# Patient Record
Sex: Female | Born: 1990 | Race: Asian | Hispanic: No | Marital: Married | Smoking: Never smoker
Health system: Southern US, Community
[De-identification: ages and names within clinical notes are randomized; demographics above are authoritative.]

## PROBLEM LIST (undated history)

## (undated) ENCOUNTER — Inpatient Hospital Stay (HOSPITAL_COMMUNITY): Payer: Self-pay

## (undated) DIAGNOSIS — Z789 Other specified health status: Secondary | ICD-10-CM

## (undated) HISTORY — PX: WISDOM TOOTH EXTRACTION: SHX21

## (undated) HISTORY — PX: NO PAST SURGERIES: SHX2092

---

## 2015-01-13 ENCOUNTER — Ambulatory Visit (INDEPENDENT_AMBULATORY_CARE_PROVIDER_SITE_OTHER): Payer: BLUE CROSS/BLUE SHIELD | Admitting: Family Medicine

## 2015-01-13 VITALS — BP 122/80 | HR 74 | Temp 98.1°F | Resp 18 | Ht 62.0 in | Wt 107.0 lb

## 2015-01-13 DIAGNOSIS — R399 Unspecified symptoms and signs involving the genitourinary system: Secondary | ICD-10-CM

## 2015-01-13 DIAGNOSIS — Z124 Encounter for screening for malignant neoplasm of cervix: Secondary | ICD-10-CM

## 2015-01-13 DIAGNOSIS — B373 Candidiasis of vulva and vagina: Secondary | ICD-10-CM

## 2015-01-13 DIAGNOSIS — N898 Other specified noninflammatory disorders of vagina: Secondary | ICD-10-CM

## 2015-01-13 DIAGNOSIS — R110 Nausea: Secondary | ICD-10-CM

## 2015-01-13 DIAGNOSIS — B3731 Acute candidiasis of vulva and vagina: Secondary | ICD-10-CM

## 2015-01-13 DIAGNOSIS — Z113 Encounter for screening for infections with a predominantly sexual mode of transmission: Secondary | ICD-10-CM | POA: Diagnosis not present

## 2015-01-13 LAB — POCT WET PREP WITH KOH
KOH Prep POC: POSITIVE
RBC Wet Prep HPF POC: NEGATIVE
Trichomonas, UA: NEGATIVE
Yeast Wet Prep HPF POC: POSITIVE

## 2015-01-13 LAB — POCT URINALYSIS DIPSTICK
Bilirubin, UA: NEGATIVE
Blood, UA: NEGATIVE
Glucose, UA: NEGATIVE
Ketones, UA: NEGATIVE
Nitrite, UA: NEGATIVE
Protein, UA: NEGATIVE
Spec Grav, UA: 1.015
Urobilinogen, UA: 0.2
pH, UA: 5.5

## 2015-01-13 LAB — POCT UA - MICROSCOPIC ONLY
Casts, Ur, LPF, POC: NEGATIVE
Crystals, Ur, HPF, POC: NEGATIVE
Mucus, UA: NEGATIVE
RBC, urine, microscopic: NEGATIVE
Yeast, UA: NEGATIVE

## 2015-01-13 LAB — POCT URINE PREGNANCY: Preg Test, Ur: NEGATIVE

## 2015-01-13 MED ORDER — FLUCONAZOLE 150 MG PO TABS: 150.0000 mg | ORAL_TABLET | Freq: Once | ORAL | Status: DC

## 2015-01-13 NOTE — Progress Notes (Signed)
Chief Complaint:  Chief Complaint  Patient presents with  . Vaginal issues    itching, soreness, x 3days  . Vaginal Discharge    x 3 days  . Gastrophageal Reflux    Pt states hard to digest food, Reflux and burning x 4days  . Abnormal Period    Pt states duration of period has changed    HPI: Shelley Conner is a 24 y.o. female who is here for 3-4 day hsitory  Vaginal soreness, white dc, and also some mild odor. She ahs never had this before.  She is sexaully active, has never had pap, G0 ,no prior history of STD or vaginal issues but has never been tested.  She has ahd some nausea but no vomiting 3-4 x, she is tryign to get pregnant with her husband, recently here from [place] for about 1 year.  Last menstrual cycle was 12/19/14 Denies fevers chill abd pain back pain Has not tried anythign for this   History reviewed. No pertinent past medical history. History reviewed. No pertinent past surgical history. History   Social History  . Marital Status: Married    Spouse Name: N/A  . Number of Children: N/A  . Years of Education: N/A   Social History Main Topics  . Smoking status: Never Smoker   . Smokeless tobacco: Not on file  . Alcohol Use: Not on file  . Drug Use: Not on file  . Sexual Activity: Not on file   Other Topics Concern  . None   Social History Narrative  . None   History reviewed. No pertinent family history. No Known Allergies Prior to Admission medications   Not on File     ROS: The patient denies fevers, chills, night sweats, unintentional weight loss, chest pain, palpitations, wheezing, dyspnea on exertion, vomiting, abdominal pain,  hematuria, melena, numbness, weakness, or tingling.   All other systems have been reviewed and were otherwise negative with the exception of those mentioned in the HPI and as above.    PHYSICAL EXAM: Filed Vitals:   01/13/15 1509  BP: 122/80  Pulse: 74  Temp: 98.1 F (36.7 C)  Resp: 18   Filed Vitals:   01/13/15 1509  Height: 5\' 2"  (1.575 m)  Weight: 107 lb (48.535 kg)   Body mass index is 19.57 kg/(m^2).  General: Alert, no acute distress HEENT:  Normocephalic, atraumatic, oropharynx patent. EOMI, PERRLA Cardiovascular:  Regular rate and rhythm, no rubs murmurs or gallops.  No Carotid bruits, radial pulse intact. No pedal edema.  Respiratory: Clear to auscultation bilaterally.  No wheezes, rales, or rhonchi.  No cyanosis, no use of accessory musculature GI: No organomegaly, abdomen is soft and non-tender, positive bowel sounds.  No masses. Skin: No rashes. Neurologic: Facial musculature symmetric. Psychiatric: Patient is appropriate throughout our interaction. Lymphatic: No cervical lymphadenopathy Musculoskeletal: Gait intact. GU-+ white dc, + irritated cervical os, no CMT no massess, lesions, or rashes   LABS: Results for orders placed or performed in visit on 01/13/15  POCT urinalysis dipstick  Result Value Ref Range   Color, UA yellow    Clarity, UA clear    Glucose, UA neg    Bilirubin, UA neg    Ketones, UA neg    Spec Grav, UA 1.015    Blood, UA neg    pH, UA 5.5    Protein, UA neg    Urobilinogen, UA 0.2    Nitrite, UA neg    Leukocytes, UA small (1+)  POCT UA - Microscopic Only  Result Value Ref Range   WBC, Ur, HPF, POC 4-6    RBC, urine, microscopic neg    Bacteria, U Microscopic trace    Mucus, UA neg    Epithelial cells, urine per micros 6-12    Crystals, Ur, HPF, POC neg    Casts, Ur, LPF, POC neg    Yeast, UA neg   POCT urine pregnancy  Result Value Ref Range   Preg Test, Ur Negative   POCT Wet Prep with KOH  Result Value Ref Range   Trichomonas, UA Negative    Clue Cells Wet Prep HPF POC 0-1    Epithelial Wet Prep HPF POC 3-5    Yeast Wet Prep HPF POC positive    Bacteria Wet Prep HPF POC 3+    RBC Wet Prep HPF POC neg    WBC Wet Prep HPF POC 6-8    KOH Prep POC Positive      EKG/XRAY:   Primary read interpreted by Dr. Conley Rolls at  [facility].   ASSESSMENT/PLAN: Encounter Diagnoses  Name Primary?  . Nausea without vomiting   . Vaginal discharge   . Screen for STD (sexually transmitted disease)   . UTI symptoms Yes  . Candidiasis of vagina    STD labs pending Rx diflucan for candida UTI Urine cx pending Fu prn  Gross sideeffects, risk and benefits, and alternatives of medications d/w patient. Patient is aware that all medications have potential sideeffects and we are unable to predict every sideeffect or drug-drug interaction that may occur.  Hamilton Capri PHUONG, DO 01/13/2015 4:29 PM

## 2015-01-13 NOTE — Patient Instructions (Signed)

## 2015-01-14 LAB — PAP IG, CT-NG, RFX HPV ASCU
Chlamydia Probe Amp: NEGATIVE
GC Probe Amp: NEGATIVE

## 2015-01-14 LAB — HIV ANTIBODY (ROUTINE TESTING W REFLEX): HIV 1&2 Ab, 4th Generation: NONREACTIVE

## 2015-01-14 LAB — RPR

## 2015-01-14 LAB — HEPATITIS C ANTIBODY: HCV Ab: NEGATIVE

## 2015-01-14 LAB — HEPATITIS B SURFACE ANTIGEN: Hepatitis B Surface Ag: NEGATIVE

## 2015-01-15 LAB — URINE CULTURE: Colony Count: 40000

## 2015-01-26 ENCOUNTER — Encounter: Payer: Self-pay | Admitting: Family Medicine

## 2015-01-26 ENCOUNTER — Telehealth: Payer: Self-pay | Admitting: Family Medicine

## 2015-01-26 DIAGNOSIS — N390 Urinary tract infection, site not specified: Secondary | ICD-10-CM

## 2015-01-26 MED ORDER — CEPHALEXIN 500 MG PO CAPS: 500.0000 mg | ORAL_CAPSULE | Freq: Two times a day (BID) | ORAL | Status: DC

## 2015-01-26 NOTE — Telephone Encounter (Signed)
Spoke with husband about labs and will send in keflex for uti

## 2015-03-25 ENCOUNTER — Ambulatory Visit (INDEPENDENT_AMBULATORY_CARE_PROVIDER_SITE_OTHER): Payer: BLUE CROSS/BLUE SHIELD | Admitting: Family Medicine

## 2015-03-25 VITALS — BP 100/64 | HR 82 | Temp 98.1°F | Resp 16 | Ht 61.0 in | Wt 105.2 lb

## 2015-03-25 DIAGNOSIS — R109 Unspecified abdominal pain: Secondary | ICD-10-CM

## 2015-03-25 DIAGNOSIS — Z3401 Encounter for supervision of normal first pregnancy, first trimester: Secondary | ICD-10-CM

## 2015-03-25 DIAGNOSIS — O219 Vomiting of pregnancy, unspecified: Secondary | ICD-10-CM | POA: Diagnosis not present

## 2015-03-25 DIAGNOSIS — Z3201 Encounter for pregnancy test, result positive: Secondary | ICD-10-CM | POA: Diagnosis not present

## 2015-03-25 DIAGNOSIS — N926 Irregular menstruation, unspecified: Secondary | ICD-10-CM

## 2015-03-25 LAB — COMPREHENSIVE METABOLIC PANEL
ALBUMIN: 4.3 g/dL (ref 3.5–5.2)
ALT: 8 U/L (ref 0–35)
AST: 13 U/L (ref 0–37)
Alkaline Phosphatase: 52 U/L (ref 39–117)
BILIRUBIN TOTAL: 0.4 mg/dL (ref 0.2–1.2)
BUN: 10 mg/dL (ref 6–23)
CO2: 21 mEq/L (ref 19–32)
Calcium: 9.6 mg/dL (ref 8.4–10.5)
Chloride: 104 mEq/L (ref 96–112)
Creat: 0.51 mg/dL (ref 0.50–1.10)
GLUCOSE: 62 mg/dL — AB (ref 70–99)
Potassium: 4.4 mEq/L (ref 3.5–5.3)
SODIUM: 137 meq/L (ref 135–145)
Total Protein: 7.2 g/dL (ref 6.0–8.3)

## 2015-03-25 LAB — POCT UA - MICROSCOPIC ONLY
BACTERIA, U MICROSCOPIC: NEGATIVE
CASTS, UR, LPF, POC: NEGATIVE
Crystals, Ur, HPF, POC: NEGATIVE
MUCUS UA: NEGATIVE
RBC, urine, microscopic: NEGATIVE
WBC, Ur, HPF, POC: NEGATIVE
Yeast, UA: NEGATIVE

## 2015-03-25 LAB — POCT CBC
GRANULOCYTE PERCENT: 60.3 % (ref 37–80)
HCT, POC: 41.9 % (ref 37.7–47.9)
HEMOGLOBIN: 13.2 g/dL (ref 12.2–16.2)
LYMPH, POC: 3.4 (ref 0.6–3.4)
MCH: 27.4 pg (ref 27–31.2)
MCHC: 31.5 g/dL — AB (ref 31.8–35.4)
MCV: 87 fL (ref 80–97)
MID (cbc): 0.7 (ref 0–0.9)
MPV: 7.8 fL (ref 0–99.8)
POC GRANULOCYTE: 6.2 (ref 2–6.9)
POC LYMPH %: 33 % (ref 10–50)
POC MID %: 6.7 % (ref 0–12)
Platelet Count, POC: 350 10*3/uL (ref 142–424)
RBC: 4.81 M/uL (ref 4.04–5.48)
RDW, POC: 13.6 %
WBC: 10.2 10*3/uL (ref 4.6–10.2)

## 2015-03-25 LAB — POCT URINALYSIS DIPSTICK
Bilirubin, UA: NEGATIVE
GLUCOSE UA: NEGATIVE
Ketones, UA: NEGATIVE
Leukocytes, UA: NEGATIVE
Nitrite, UA: NEGATIVE
Protein, UA: NEGATIVE
RBC UA: NEGATIVE
Spec Grav, UA: 1.01
UROBILINOGEN UA: 0.2
pH, UA: 6

## 2015-03-25 LAB — POCT URINE PREGNANCY: PREG TEST UR: POSITIVE

## 2015-03-25 NOTE — Progress Notes (Addendum)
Subjective:  This chart was scribed for Norberto SorensonEva Shaw, MD by Stann Oresung-Kai Tsai, Medical Scribe. This patient was seen in Room 10 and the patient's care was started 1:01 PM.    Patient ID: Shelley Conner, female    DOB: [birth year], 24 y.o.   MRN: [id number] Chief Complaint  Patient presents with  . Abdominal Pain    x2 weeks  . Nausea  . Emesis  . Diarrhea  . Amenorrhea    last period was in April    HPI Shelley Conner is a 24 y.o. female [facility] of intermittent lower abdominal pain starting a month ago after her last period. Pt reports that she was expecting her menstrual period last week; LNMP was 02/16/15. She denies h/o irregular menstrual periods. She reports associated nausea, diarrhea, fatigue, weakness, dizziness, intermittent light-headedness. She denies fever, chills, loss of appetite, vomiting, dysuria, vaginal discharge, urinary symptoms. Pt denies medications for symptoms. She only takes medication to relieve menstraual cramps. She does not use birth control and states that she is trying to conceive. No h/o previous pregnancies. She has not pre-natal vitamins and supplements. Pt's last full physical was around 2 months ago.  History reviewed. No pertinent past medical history. No current outpatient prescriptions on file prior to visit.   No current facility-administered medications on file prior to visit.   No Known Allergies   Review of Systems  Constitutional: Positive for fatigue. Negative for fever, chills and appetite change.  Gastrointestinal: Positive for nausea, abdominal pain and diarrhea. Negative for vomiting.  Genitourinary: Negative for dysuria and vaginal discharge.  Neurological: Positive for dizziness, weakness and light-headedness.   BP 100/64 mmHg  Pulse 82  Temp(Src) 98.1 F (36.7 C) (Oral)  Resp 16  Ht 5\' 1"  (1.549 m)  Wt 105 lb 3.2 oz (47.718 kg)  BMI 19.89 kg/m2  SpO2 97%  LMP 02/16/2015     Objective:   Physical Exam    Constitutional: She is oriented to person, place, and time. She appears well-developed and well-nourished. No distress.  HENT:  Head: Normocephalic and atraumatic.  Eyes: EOM are normal. Pupils are equal, round, and reactive to light.  Neck: Neck supple.  Cardiovascular: Normal rate, regular rhythm and normal heart sounds.   No murmur heard. Pulses:      Dorsalis pedis pulses are 2+ on the right side, and 2+ on the left side.  Pulmonary/Chest: Effort normal and breath sounds normal. No respiratory distress.  Abdominal: Soft. Bowel sounds are normal. She exhibits no distension. There is no hepatosplenomegaly. There is CVA tenderness (left ).  Musculoskeletal: Normal range of motion. She exhibits no edema.  Neurological: She is alert and oriented to person, place, and time.  Skin: Skin is warm and dry.  Psychiatric: She has a normal mood and affect. Her behavior is normal.  Nursing note and vitals reviewed.     Assessment & Plan:   1. Missed period   2. Abdominal pain, unspecified abdominal location   3. Encounter for supervision of normal first pregnancy in first trimester   4. Vomiting or nausea of pregnancy     Orders Placed This Encounter  Procedures  . Comprehensive metabolic panel  . POCT urine pregnancy  . POCT CBC  . POCT UA - Microscopic Only  . POCT urinalysis dipstick    language level caveat - pt given information on normal first trimester diet guidelines, ways to treat/prevent nausea, start pnv, rest - reviewed at only [redacted] wk gestation so  is possibility of miscarriage without any inciting event is still fairly hight for another mo so RTC immed if vag bleeding. Given info on obstetricians pt can f/u w.  I personally performed the services described in this documentation, which was scribed in my presence. The recorded information has been reviewed and considered, and addended by me as needed.  Norberto SorensonEva Shaw, MD MPH  Results for orders placed or performed in visit on  03/25/15  Comprehensive metabolic panel  Result Value Ref Range   Sodium 137 135 - 145 mEq/L   Potassium 4.4 3.5 - 5.3 mEq/L   Chloride 104 96 - 112 mEq/L   CO2 21 19 - 32 mEq/L   Glucose, Bld 62 (L) 70 - 99 mg/dL   BUN 10 6 - 23 mg/dL   Creat 2.840.51 1.320.50 - 4.401.10 mg/dL   Total Bilirubin 0.4 0.2 - 1.2 mg/dL   Alkaline Phosphatase 52 39 - 117 U/L   AST 13 0 - 37 U/L   ALT 8 0 - 35 U/L   Total Protein 7.2 6.0 - 8.3 g/dL   Albumin 4.3 3.5 - 5.2 g/dL   Calcium 9.6 8.4 - 10.210.5 mg/dL  POCT urine pregnancy  Result Value Ref Range   Preg Test, Ur Positive   POCT CBC  Result Value Ref Range   WBC 10.2 4.6 - 10.2 K/uL   Lymph, poc 3.4 0.6 - 3.4   POC LYMPH PERCENT 33.0 10 - 50 %L   MID (cbc) 0.7 0 - 0.9   POC MID % 6.7 0 - 12 %M   POC Granulocyte 6.2 2 - 6.9   Granulocyte percent 60.3 37 - 80 %G   RBC 4.81 4.04 - 5.48 M/uL   Hemoglobin 13.2 12.2 - 16.2 g/dL   HCT, POC 72.541.9 36.637.7 - 47.9 %   MCV 87.0 80 - 97 fL   MCH, POC 27.4 27 - 31.2 pg   MCHC 31.5 (A) 31.8 - 35.4 g/dL   RDW, POC 44.013.6 %   Platelet Count, POC 350 142 - 424 K/uL   MPV 7.8 0 - 99.8 fL  POCT UA - Microscopic Only  Result Value Ref Range   WBC, Ur, HPF, POC neg    RBC, urine, microscopic neg    Bacteria, U Microscopic neg    Mucus, UA neg    Epithelial cells, urine per micros 0-1    Crystals, Ur, HPF, POC neg    Casts, Ur, LPF, POC neg    Yeast, UA neg   POCT urinalysis dipstick  Result Value Ref Range   Color, UA yellow    Clarity, UA clear    Glucose, UA neg    Bilirubin, UA neg    Ketones, UA neg    Spec Grav, UA 1.010    Blood, UA neg    pH, UA 6.0    Protein, UA neg    Urobilinogen, UA 0.2    Nitrite, UA neg    Leukocytes, UA Negative

## 2015-03-25 NOTE — Patient Instructions (Addendum)
Nn nghn (Hyperemesis Gravidarum) Ch?ng nn nghn l m?t d?ng n?ng c?a bu?n nn v nn n?ng x?y ra trong th?i k? mang [place]. Ch?ng nn t?i t? h?n tnh tr?ng ?m nghn. N c th? khi?n cho qu v? b? bu?n nn ho?c nn su?t c? ngy trong nhi?u ngy. N c th? khi?n cho qu v? khng th? ?n v u?ng ?? th?c ?n v ch?t l?ng. Ch?ng nn th??ng x?y ra trong n?a ??u (20 tu?n ??u) c?a [place] k?. N th??ng h?t sau khi ng??i mang [place] ? n?a sau c?a [place] k?. Tuy nhin, ?i khi ch?ng nn ti?p t?c sut ton b? thai k?.  NGUYN NHN.  Nguyn nhn c?a tnh tr?ng ny khng hon ton ???c xc ??nh nh?ng ???c cho l lin quan ??n s? thay ??i hocmon c?a c? th? khi mang [place]. N c th? do n?ng ?? hocmon thai nghn cao ho?c t?ng estrogen trong c? th?.  D?U HI?U V TRI?U CH?NG   Bu?n nn v nn m?a n?ng.  Bu?n nn khng h?t.  Nn m?a khi?n qu v? khng th? gi? ???c b?t k? th?c ?n no trong c? th?.  Gi?m cn v m?t n??c trong c? th? (m?t n??c).  Khng mu?n ?n ho?c khng thch th?c ?n m tr??c ?y qu v? r?t thch. CH?N ?ON  Chuyn gia ch?m [place] s?c kh?e c?a qu v? s? khm th?c th? v h?i v? nh?ng tri?u ch?ng c?a qu v?. Chuyn gia ch?m [place] s?c kh?e c?ng c th? yu c?u xt nghi?m mu v xt nghi?m n??c ti?u ?? ??m b?o khng c nguyn nhn no khc gy ra v?n ?? ny.  ?I?U TR?  Qu v? c th? ch? c?n thu?c ?? ki?m sot v?n ?? ny. N?u thu?c khng ki?m sot ???c hi?n t??ng bu?n nn v nn m?a, qu v? s? ???c ?i?u tr? trong b?nh vi?n ?? ng?n ch?n tnh tr?ng m?t n??c, t?ng a xt trong mu (nhi?m toan), s?t cn, v nh?ng thay ??i v? ?i?n gi?i trong c? th? c th? gy h?i cho em b trong b?ng m? (bo [place]). Qu v? c th? c?n truy?n d?ch ???ng t?nh m?ch (IV).  H??NG D?N CH?M [place] T?I NH   Ch? s? d?ng thu?c khng c?n k ??n ho?c thu?c c?n k ??n theo ch? d?n c?a chuyn gia ch?m  [place] s?c kh?e.  Hy c? g?ng ?n m?t vi chi?c bnh quy ho?c bnh m n??ng vo bu?i sng tr??c khi ra kh?i gi??ng.  Trnh th?c ?n v mi lm d? dy kh  ch?u.  Trnh th?c ?n bo v nhi?u gia v?.  ?n 5 - 6 b?a ?n nh? m?i ngy.  Khng u?ng trong khi ?n. U?ng gi?a cc b?a ?n.  ??i v?i ?? ?n nh?, hy ?n nh?ng lo?i th?c ph?m ch?a nhi?u protein, ch?ng h?n nh? pho mt.  ?n ho?c ht nh?ng ?? ?n, ?? u?ng c g?ng. G?ng gip lm gi?m c?m gic bu?n nn.  Trnh chu?n b? ?? ?n. Mi th?c ?n c th? ?nh h??ng ??n kh?u v? c?a qu v?.  Trnh dng vin s?t v s?t trong vitamin t?ng h?p c?a qu v? cho ??n sau 3 - 4 thng mang [place]. Tuy nhin, hy tham kh?o  ki?n c?a chuyn gia ch?m [place] s?c kh?e tr??c khi d?ng b?t k? lo?i thu?c c s?t no ? k ??n. ?I KHM N?U:   C?n ?au b?ng c?a qu v? t?ng ln.  Qu v? b? ?au ??u nhi?u.  Qu v? c v?n ??  v? th? l?c.  Qu v? b? s?t cn. NGAY L?P T?C ?I KHM N?U:   Qu v? khng th? gi? ???c ch?t l?ng trong ng??i.  Qu v? nn ra mu.  Qu v? b? bu?n nn v nn m?a lin t?c.  Qu v? r?t m?t m?i.  Qu v? r?t kht n??c.  Qu v? b? chng m?t ho?c b? ng?t x?u.  Qu v? b? s?t ho?c c cc tri?u ch?ng ko di h?n 2 - 3 ngy.  Qu v? b? s?t v cc tri?u ch?ng c?a qu v? ??t nhin n?ng h?n. ??M B?O QU V?:   Hi?u r cc h??ng d?n ny.  S? theo di tnh tr?ng c?a mnh.  S? yu c?u tr? gip ngay l?p t?c n?u qu v? c?m th?y khng kh?e ho?c th?y tr?m tr?ng h?n. Document Released: 11/01/2005 Document Revised: 08/22/2013 [facility] Patient Information 2015 [place], [place]. This information is not intended to replace advice given to you by your health care provider. Make sure you discuss any questions you have with your health care provider. ? nng trong khi mang thai  (Heartburn During Pregnancy ) ? nng l m?t c?m gic nng rt ? ng?c do axit d? dy tro ng??c ln th?c qu?n gy ra. ? nng ph? bi?n trong th?i k? mang thai v m?t hocmon ? bi?t (progesterone) ???c gi?i phng ra khi ph? n? mang [place]. Hc mn progesterone c th? lm l?ng van ng?n cch th?c qu?n v?i d? dy. ?i?u ny lm cho axit ?i ng??c ln th?c qu?n, gy  ? nng. ? nng c?ng c th? x?y ra trong khi mang [place] do t? cung to ra, ??y ng??c ln d? dy, ??y nhi?u axit h?n vo th?c qu?n. ?i?u ny ??c bi?t ?ng trong giai ?o?n cu?i c?a [place] k?. V?n ?? ? nng th??ng bi?n m?t sau khi sinh. NGUYN NHN.  ? nng do axit d? dy tro ng??c ln th?c qu?n gy ra. Trong khi mang thai, vi?c ny c th? do nhi?u nguyn nhn khc nhau, bao g?m:   Hc mn progesterone.  Thay ??i n?ng ?? hc mn.  T? cung pht tri?n, ??y axit d? dy ng??c ln.  B?a ?n no.  M?t s? th?c ph?m v ?? u?ng nh?t ??nh.  T?p luy?n.  T?ng s?n sinh axit. D?U HI?U V TRI?U CH?NG   ?au rt ? ng?c ho?c h?ng d??i.  C?m gic ??ng trong mi?ng.  Ho. CH?N ?ON  Chuyn gia ch?m [place] s?c kh?e s? th??ng ch?n ?on ? nng b?ng cch h?i k? ti?n s? cc v?n ?? lin quan c?a qu vi. C th? c?n xt nghi?m mu ?? ki?m tra m?t lo?i vi khu?n nh?t ??nh c lin quan t?i ? nng. ?i khi, ? nng ???c ch?n ?on b?ng k ??n dng thu?c ?i?u tr? ? nng ?? xem tri?u ch?ng c ???c c?i thi?n khng. Trong m?t s? tr??ng h?p c th? ph?i lm m?t th? thu?t c tn l n?i soi. Trong th? thu?t ny, m?t ?ng c ?n v m?t my ?nh ? ??u (?n n?i soi) ???c s? d?ng ?? ki?m tra th?c qu?n v d? dy. ?I?U TR?  Vi?c ?i?u tr? s? khc nhau ty thu?c vo m?c ?? n?ng c?a cc tri?u ch?ng. Chuyn gia ch?m [place] s?c kh?e c?a qu v? c th? khuy?n ngh?:  S? d?ng m?t s? thu?c khng c?n k ??n (thu?c trung ha axit d?ch v?, thu?c lm gi?m axit d?ch v?) ?? ?i?u tr? ? nng m?c ?? nh?.  Cc lo?i  thu?c k ??n lm gi?m axit d? dy ho?c ?? b?o v? nim m?c d? dy.  M?t s? thay ??i nh?t ??nh trong ch? ?? ?n c?a qu v?.  Nng cao ??u gi??ng c?a qu v? b?ng cch k cc c?c g?ch d??i chn gi??ng. Vi?c ny gip ng?n ng?a a xt trong d? dy khng tro ng??c ln th?c qu?n khi qu v? n?m. H??NG D?N CH?M [place] T?I NH   Ch? s? d?ng thu?c khng c?n k ??n ho?c thu?c c?n k ??n theo ch? d?n c?a chuyn gia ch?m [place] s?c kh?e.  Nng cao ??u gi??ng c?a qu v?  b?ng cch k cc c?c g?ch d??i chn gi??ng n?u chuyn gia ch?m [place] s?c kh?e yu c?u lm nh? v?y. Dng nhi?u g?i h?n khi ng? l khng hi?u qu? v ?i?u ? ch? lm thay ??i t? th? ??u c?a qu v?.  Khngt?p th? d?c ngay sau khi ?n.  Trnh ?n tr??c khi ?i ng? 2-3 ti?ng. Khng n?m ngay sau khi ?n.  ?n nhi?u b?a nh? trong ngy thay v ?n 3 b?a no.  Xc ??nh cc lo?i th?c ph?m v ?? u?ng lm cho cc tri?u ch?ng t?i t? h?n v trnh dng chng. Cc lo?i th?c ph?m qu v? c th? mu?n trnh dng bao g?m:  H?t tiu.  S c la.  Th?c ?n nhi?u ch?t bo, bao g?m th?c ?n chin, rn.  Th?c ?n [place].  T?i v hnh.  Cc lo?i qu? thu?c gi?ng cam qut, g?m cam, b??i, chanh, v chanh l cam.  Th?c ph?m ch?a c chua ho?c cc s?n ph?m lm t? c chua.  B?c h.  ?? u?ng c ga v caffein.  Gi?m. ?I KHM N?U:  Qu v? b? b?t k? ki?u ?au b?ng no.  Qu v? c?m th?y nng ? b?ng trn ho?c ng?c, ??c bi?t l sau khi ?n ho?c n?m xu?ng.  Qu v? b? bu?n nn v nn m?a.  Qu v? c?m th?y kh ch?u trong d? dy sau khi ?n. NGAY L?P T?C ?I KHM N?U:   Qu v? b? ?au ng?c n?ng lan xu?ng cnh tay ho?c ra hm ho?c c?.  Qu v? c c?m gic ?? m? hi, hoa m?t hay chng m?t.  Qu v? th?y kh th?.  Qu v? nn ra mu.  Qu v? kh nu?t ho?c b? ?au khi nu?t.  Phn c?a qu v? c mu ho?c c mu ?en nh? h?c n.  Qu v? c nh?ng c?n ? nng nhi?u h?n 3 l?n m?i tu?n, trong h?n 2 tu?n. ??M B?O QU V?:  Hi?u r cc h??ng d?n ny.  S? theo di tnh tr?ng c?a mnh.  S? yu c?u tr? gip ngay l?p t?c n?u qu v? c?m th?y khng kh?e ho?c th?y tr?m tr?ng h?n. Document Released: 11/01/2005 Document Revised: 08/22/2013 [facility] Patient Information 2015 [place] MillsExitCare, MarylandLLC. This information is not intended to replace advice given to you by your health care provider. Make sure you discuss any questions you have with your health care provider.  Assuming LMP began 02/16/2015, due date is 11/23/2015 and today you are 5 weeks and 2 days  pregnant. Please be seen by an Obstetrician by May 10, 2015  [facility]: Dr. Haskel KhanFemina [phone]  [place]: Dr. Cherly Hensenousins [phone]

## 2015-03-26 ENCOUNTER — Encounter: Payer: Self-pay | Admitting: Family Medicine

## 2015-04-28 ENCOUNTER — Other Ambulatory Visit (HOSPITAL_COMMUNITY): Payer: Self-pay | Admitting: Physician Assistant

## 2015-04-28 DIAGNOSIS — Z3682 Encounter for antenatal screening for nuchal translucency: Secondary | ICD-10-CM

## 2015-04-28 LAB — OB RESULTS CONSOLE ABO/RH: RH Type: POSITIVE

## 2015-04-28 LAB — OB RESULTS CONSOLE RUBELLA ANTIBODY, IGM: Rubella: IMMUNE

## 2015-04-28 LAB — OB RESULTS CONSOLE VARICELLA ZOSTER ANTIBODY, IGG: Varicella: IMMUNE

## 2015-04-28 LAB — OB RESULTS CONSOLE HIV ANTIBODY (ROUTINE TESTING): HIV: NONREACTIVE

## 2015-04-28 LAB — OB RESULTS CONSOLE ANTIBODY SCREEN: Antibody Screen: NEGATIVE

## 2015-04-28 LAB — OB RESULTS CONSOLE GC/CHLAMYDIA
Chlamydia: NEGATIVE
Gonorrhea: NEGATIVE

## 2015-04-28 LAB — OB RESULTS CONSOLE RPR: RPR: NONREACTIVE

## 2015-04-28 LAB — OB RESULTS CONSOLE HEPATITIS B SURFACE ANTIGEN: HEP B S AG: NEGATIVE

## 2015-05-09 ENCOUNTER — Encounter (HOSPITAL_COMMUNITY): Payer: Self-pay

## 2015-05-09 ENCOUNTER — Ambulatory Visit (HOSPITAL_COMMUNITY)
Admission: RE | Admit: 2015-05-09 | Discharge: 2015-05-09 | Disposition: A | Discharge status: Home / Self Care | Payer: BLUE CROSS/BLUE SHIELD | Source: Ambulatory Visit | Attending: Physician Assistant | Admitting: Physician Assistant

## 2015-05-09 DIAGNOSIS — Z3A11 11 weeks gestation of pregnancy: Secondary | ICD-10-CM | POA: Diagnosis not present

## 2015-05-09 DIAGNOSIS — Z3682 Encounter for antenatal screening for nuchal translucency: Secondary | ICD-10-CM | POA: Insufficient documentation

## 2015-05-09 DIAGNOSIS — Z36 Encounter for antenatal screening of mother: Secondary | ICD-10-CM | POA: Insufficient documentation

## 2015-05-09 HISTORY — DX: Other specified health status: Z78.9

## 2015-05-23 ENCOUNTER — Other Ambulatory Visit (HOSPITAL_COMMUNITY): Payer: Self-pay | Admitting: Physician Assistant

## 2015-05-26 ENCOUNTER — Other Ambulatory Visit (HOSPITAL_COMMUNITY): Payer: Self-pay | Admitting: Nurse Practitioner

## 2015-05-26 DIAGNOSIS — Z3689 Encounter for other specified antenatal screening: Secondary | ICD-10-CM

## 2015-06-30 ENCOUNTER — Ambulatory Visit (HOSPITAL_COMMUNITY)
Admission: RE | Admit: 2015-06-30 | Discharge: 2015-06-30 | Disposition: A | Discharge status: Home / Self Care | Payer: Medicaid Other | Source: Ambulatory Visit | Attending: Nurse Practitioner | Admitting: Nurse Practitioner

## 2015-06-30 DIAGNOSIS — Z36 Encounter for antenatal screening of mother: Secondary | ICD-10-CM | POA: Insufficient documentation

## 2015-06-30 DIAGNOSIS — Z3689 Encounter for other specified antenatal screening: Secondary | ICD-10-CM

## 2015-07-14 ENCOUNTER — Other Ambulatory Visit (HOSPITAL_COMMUNITY): Payer: Self-pay | Admitting: Urology

## 2015-07-14 DIAGNOSIS — Z3A23 23 weeks gestation of pregnancy: Secondary | ICD-10-CM

## 2015-07-14 DIAGNOSIS — Z3689 Encounter for other specified antenatal screening: Secondary | ICD-10-CM

## 2015-08-04 ENCOUNTER — Ambulatory Visit (HOSPITAL_COMMUNITY)
Admission: RE | Admit: 2015-08-04 | Discharge: 2015-08-04 | Disposition: A | Discharge status: Home / Self Care | Payer: Medicaid Other | Source: Ambulatory Visit | Attending: Physician Assistant | Admitting: Physician Assistant

## 2015-08-04 DIAGNOSIS — Z3689 Encounter for other specified antenatal screening: Secondary | ICD-10-CM

## 2015-08-04 DIAGNOSIS — Z36 Encounter for antenatal screening of mother: Secondary | ICD-10-CM | POA: Insufficient documentation

## 2015-08-04 DIAGNOSIS — Z3A23 23 weeks gestation of pregnancy: Secondary | ICD-10-CM | POA: Insufficient documentation

## 2015-10-29 LAB — OB RESULTS CONSOLE GBS: GBS: NEGATIVE

## 2015-11-16 NOTE — L&D Delivery Note (Signed)
Delivery Note At 1:00 PM a viable female was delivered via Vaginal, Spontaneous Delivery (Presentation: Left Occiput Anterior).  APGAR: 9, 9; weight  .   Placenta status: Intact, Spontaneous.  Cord: 3 vessels with the following complications: None. Pt comfortable with epidural during delivery and repair.   Anesthesia: Epidural  Episiotomy:  none Lacerations: 2nd degree  Suture Repair: 2.0 vicryl Est. Blood Loss (mL):    Mom to postpartum.  Baby to Couplet care / Skin to Skin. Dr Jolayne Pantheronstant consulted to do repair Para Cossey Grissett 11/21/2015, 1:25 PM

## 2015-11-20 ENCOUNTER — Encounter (HOSPITAL_COMMUNITY): Payer: Self-pay | Admitting: *Deleted

## 2015-11-20 ENCOUNTER — Inpatient Hospital Stay (HOSPITAL_COMMUNITY)
Admission: AD | Admit: 2015-11-20 | Discharge: 2015-11-20 | Disposition: A | Discharge status: Home / Self Care | Payer: Medicaid Other | Source: Ambulatory Visit | Attending: Family Medicine | Admitting: Family Medicine

## 2015-11-20 NOTE — Discharge Instructions (Signed)
Fetal Movement Counts Patient Name: __________________________________________________ Patient Due Date: ____________________ Performing a fetal movement count is highly recommended in high-risk pregnancies, but it is good for every pregnant woman to do. Your health care provider may ask you to start counting fetal movements at 28 weeks of the pregnancy. Fetal movements often increase:  After eating a full meal.  After physical activity.  After eating or drinking something sweet or cold.  At rest. Pay attention to when you feel the baby is most active. This will help you notice a pattern of your baby's sleep and wake cycles and what factors contribute to an increase in fetal movement. It is important to perform a fetal movement count at the same time each day when your baby is normally most active.  HOW TO COUNT FETAL MOVEMENTS 1. Find a quiet and comfortable area to sit or lie down on your left side. Lying on your left side provides the best blood and oxygen circulation to your baby. 2. Write down the day and time on a sheet of paper or in a journal. 3. Start counting kicks, flutters, swishes, rolls, or jabs in a 2-hour period. You should feel at least 10 movements within 2 hours. 4. If you do not feel 10 movements in 2 hours, wait 2-3 hours and count again. Look for a change in the pattern or not enough counts in 2 hours. SEEK MEDICAL CARE IF:  You feel less than 10 counts in 2 hours, tried twice.  There is no movement in over an hour.  The pattern is changing or taking longer each day to reach 10 counts in 2 hours.  You feel the baby is not moving as he or she usually does. Date: ____________ Movements: ____________ Start time: ____________ Finish time: ____________  Date: ____________ Movements: ____________ Start time: ____________ Finish time: ____________ Date: ____________ Movements: ____________ Start time: ____________ Finish time: ____________ Date: ____________ Movements:  ____________ Start time: ____________ Finish time: ____________ Date: ____________ Movements: ____________ Start time: ____________ Finish time: ____________ Date: ____________ Movements: ____________ Start time: ____________ Finish time: ____________ Date: ____________ Movements: ____________ Start time: ____________ Finish time: ____________ Date: ____________ Movements: ____________ Start time: ____________ Finish time: ____________  Date: ____________ Movements: ____________ Start time: ____________ Finish time: ____________ Date: ____________ Movements: ____________ Start time: ____________ Finish time: ____________ Date: ____________ Movements: ____________ Start time: ____________ Finish time: ____________ Date: ____________ Movements: ____________ Start time: ____________ Finish time: ____________ Date: ____________ Movements: ____________ Start time: ____________ Finish time: ____________ Date: ____________ Movements: ____________ Start time: ____________ Finish time: ____________ Date: ____________ Movements: ____________ Start time: ____________ Finish time: ____________  Date: ____________ Movements: ____________ Start time: ____________ Finish time: ____________ Date: ____________ Movements: ____________ Start time: ____________ Finish time: ____________ Date: ____________ Movements: ____________ Start time: ____________ Finish time: ____________ Date: ____________ Movements: ____________ Start time: ____________ Finish time: ____________ Date: ____________ Movements: ____________ Start time: ____________ Finish time: ____________ Date: ____________ Movements: ____________ Start time: ____________ Finish time: ____________ Date: ____________ Movements: ____________ Start time: ____________ Finish time: ____________  Date: ____________ Movements: ____________ Start time: ____________ Finish time: ____________ Date: ____________ Movements: ____________ Start time: ____________ Finish  time: ____________ Date: ____________ Movements: ____________ Start time: ____________ Finish time: ____________ Date: ____________ Movements: ____________ Start time: ____________ Finish time: ____________ Date: ____________ Movements: ____________ Start time: ____________ Finish time: ____________ Date: ____________ Movements: ____________ Start time: ____________ Finish time: ____________ Date: ____________ Movements: ____________ Start time: ____________ Finish time: ____________  Date: ____________ Movements: ____________ Start time: ____________ Finish   time: ____________ Date: ____________ Movements: ____________ Start time: ____________ Finish time: ____________ Date: ____________ Movements: ____________ Start time: ____________ Finish time: ____________ Date: ____________ Movements: ____________ Start time: ____________ Finish time: ____________ Date: ____________ Movements: ____________ Start time: ____________ Finish time: ____________ Date: ____________ Movements: ____________ Start time: ____________ Finish time: ____________ Date: ____________ Movements: ____________ Start time: ____________ Finish time: ____________  Date: ____________ Movements: ____________ Start time: ____________ Finish time: ____________ Date: ____________ Movements: ____________ Start time: ____________ Finish time: ____________ Date: ____________ Movements: ____________ Start time: ____________ Finish time: ____________ Date: ____________ Movements: ____________ Start time: ____________ Finish time: ____________ Date: ____________ Movements: ____________ Start time: ____________ Finish time: ____________ Date: ____________ Movements: ____________ Start time: ____________ Finish time: ____________ Date: ____________ Movements: ____________ Start time: ____________ Finish time: ____________  Date: ____________ Movements: ____________ Start time: ____________ Finish time: ____________ Date: ____________  Movements: ____________ Start time: ____________ Finish time: ____________ Date: ____________ Movements: ____________ Start time: ____________ Finish time: ____________ Date: ____________ Movements: ____________ Start time: ____________ Finish time: ____________ Date: ____________ Movements: ____________ Start time: ____________ Finish time: ____________ Date: ____________ Movements: ____________ Start time: ____________ Finish time: ____________ Date: ____________ Movements: ____________ Start time: ____________ Finish time: ____________  Date: ____________ Movements: ____________ Start time: ____________ Finish time: ____________ Date: ____________ Movements: ____________ Start time: ____________ Finish time: ____________ Date: ____________ Movements: ____________ Start time: ____________ Finish time: ____________ Date: ____________ Movements: ____________ Start time: ____________ Finish time: ____________ Date: ____________ Movements: ____________ Start time: ____________ Finish time: ____________ Date: ____________ Movements: ____________ Start time: ____________ Finish time: ____________   This information is not intended to replace advice given to you by your health care provider. Make sure you discuss any questions you have with your health care provider.   Document Released: 12/01/2006 Document Revised: 11/22/2014 Document Reviewed: 08/28/2012 Elsevier Interactive Patient Education 2016 [employer] Braxton Hicks Contractions Contractions of the uterus can occur throughout pregnancy. Contractions are not always a sign that you are in labor.  WHAT ARE BRAXTON HICKS CONTRACTIONS?  Contractions that occur before labor are called Braxton Hicks contractions, or false labor. Toward the end of pregnancy (32-34 weeks), these contractions can develop more often and may become more forceful. This is not true labor because these contractions do not result in opening (dilatation) and thinning of  the cervix. They are sometimes difficult to tell apart from true labor because these contractions can be forceful and people have different pain tolerances. You should not feel embarrassed if you go to the hospital with false labor. Sometimes, the only way to tell if you are in true labor is for your health care provider to look for changes in the cervix. If there are no prenatal problems or other health problems associated with the pregnancy, it is completely safe to be sent home with false labor and await the onset of true labor. HOW CAN YOU TELL THE DIFFERENCE BETWEEN TRUE AND FALSE LABOR? False Labor  The contractions of false labor are usually shorter and not as hard as those of true labor.   The contractions are usually irregular.   The contractions are often felt in the front of the lower abdomen and in the groin.   The contractions may go away when you walk around or change positions while lying down.   The contractions get weaker and are shorter lasting as time goes on.   The contractions do not usually become progressively stronger, regular, and closer together as with true labor.  True Labor 5. Contractions in true   labor last 30-70 seconds, become very regular, usually become more intense, and increase in frequency.  6. The contractions do not go away with walking.  7. The discomfort is usually felt in the top of the uterus and spreads to the lower abdomen and low back.  8. True labor can be determined by your health care provider with an exam. This will show that the cervix is dilating and getting thinner.  WHAT TO REMEMBER  Keep up with your usual exercises and follow other instructions given by your health care provider.   Take medicines as directed by your health care provider.   Keep your regular prenatal appointments.   Eat and drink lightly if you think you are going into labor.   If Braxton Hicks contractions are making you uncomfortable:   Change  your position from lying down or resting to walking, or from walking to resting.   Sit and rest in a tub of warm water.   Drink 2-3 glasses of water. Dehydration may cause these contractions.   Do slow and deep breathing several times an hour.  WHEN SHOULD I SEEK IMMEDIATE MEDICAL CARE? Seek immediate medical care if:  Your contractions become stronger, more regular, and closer together.   You have fluid leaking or gushing from your vagina.   You have a fever.   You pass blood-tinged mucus.   You have vaginal bleeding.   You have continuous abdominal pain.   You have low back pain that you never had before.   You feel your baby's head pushing down and causing pelvic pressure.   Your baby is not moving as much as it used to.    This information is not intended to replace advice given to you by your health care provider. Make sure you discuss any questions you have with your health care provider.   Document Released: 11/01/2005 Document Revised: 11/06/2013 Document Reviewed: 08/13/2013 Elsevier Interactive Patient Education 2016 Elsevier Inc[employer]  

## 2015-11-20 NOTE — MAU Note (Signed)
Notified provider that patient came in with c/o of contractions. Checked her and she was 1.2 c. Rechecked her in an hour and she was unchanged. Baby reactive on monitor. Provider said to discharge patient.

## 2015-11-20 NOTE — MAU Note (Signed)
PT SAYS  HURT  BAD  AT 730PM.   PNC-  HD-     NO  VE.    GBS- NEG

## 2015-11-21 ENCOUNTER — Encounter (HOSPITAL_COMMUNITY): Payer: Self-pay | Admitting: Anesthesiology

## 2015-11-21 ENCOUNTER — Inpatient Hospital Stay (HOSPITAL_COMMUNITY): Payer: Medicaid Other | Admitting: Anesthesiology

## 2015-11-21 ENCOUNTER — Inpatient Hospital Stay (HOSPITAL_COMMUNITY)
Admission: AD | Admit: 2015-11-21 | Discharge: 2015-11-23 | DRG: 775 | Disposition: A | Discharge status: Home / Self Care | Payer: Medicaid Other | Source: Ambulatory Visit | Attending: Family Medicine | Admitting: Family Medicine

## 2015-11-21 DIAGNOSIS — IMO0001 Reserved for inherently not codable concepts without codable children: Secondary | ICD-10-CM

## 2015-11-21 DIAGNOSIS — Z3A39 39 weeks gestation of pregnancy: Secondary | ICD-10-CM

## 2015-11-21 LAB — CBC
HEMATOCRIT: 39.3 % (ref 36.0–46.0)
Hemoglobin: 13.1 g/dL (ref 12.0–15.0)
MCH: 30 pg (ref 26.0–34.0)
MCHC: 33.3 g/dL (ref 30.0–36.0)
MCV: 89.9 fL (ref 78.0–100.0)
PLATELETS: 285 10*3/uL (ref 150–400)
RBC: 4.37 MIL/uL (ref 3.87–5.11)
RDW: 13.6 % (ref 11.5–15.5)
WBC: 16.7 10*3/uL — AB (ref 4.0–10.5)

## 2015-11-21 LAB — TYPE AND SCREEN
ABO/RH(D): O POS
Antibody Screen: NEGATIVE

## 2015-11-21 LAB — ABO/RH: ABO/RH(D): O POS

## 2015-11-21 MED ORDER — FLEET ENEMA 7-19 GM/118ML RE ENEM: 1.0000 | ENEMA | RECTAL | Status: DC | PRN

## 2015-11-21 MED ORDER — LANOLIN HYDROUS EX OINT: TOPICAL_OINTMENT | CUTANEOUS | Status: DC | PRN

## 2015-11-21 MED ORDER — DIBUCAINE 1 % RE OINT: 1.0000 "application " | TOPICAL_OINTMENT | RECTAL | Status: DC | PRN

## 2015-11-21 MED ORDER — PHENYLEPHRINE 40 MCG/ML (10ML) SYRINGE FOR IV PUSH (FOR BLOOD PRESSURE SUPPORT)
80.0000 ug | PREFILLED_SYRINGE | INTRAVENOUS | Status: DC | PRN
  Filled 2015-11-21: qty 20

## 2015-11-21 MED ORDER — WITCH HAZEL-GLYCERIN EX PADS: 1.0000 "application " | MEDICATED_PAD | CUTANEOUS | Status: DC | PRN

## 2015-11-21 MED ORDER — DIPHENHYDRAMINE HCL 50 MG/ML IJ SOLN: 12.5000 mg | INTRAMUSCULAR | Status: DC | PRN

## 2015-11-21 MED ORDER — ZOLPIDEM TARTRATE 5 MG PO TABS: 5.0000 mg | ORAL_TABLET | Freq: Every evening | ORAL | Status: DC | PRN

## 2015-11-21 MED ORDER — ACETAMINOPHEN 325 MG PO TABS: 650.0000 mg | ORAL_TABLET | ORAL | Status: DC | PRN

## 2015-11-21 MED ORDER — EPHEDRINE 5 MG/ML INJ: 10.0000 mg | INTRAVENOUS | Status: DC | PRN

## 2015-11-21 MED ORDER — LIDOCAINE HCL (PF) 1 % IJ SOLN
INTRAMUSCULAR | Status: DC | PRN
  Administered 2015-11-21: 6 mL via EPIDURAL
  Administered 2015-11-21: 8 mL via EPIDURAL

## 2015-11-21 MED ORDER — LACTATED RINGERS IV SOLN
INTRAVENOUS | Status: DC
  Administered 2015-11-21 (×2): via INTRAVENOUS

## 2015-11-21 MED ORDER — OXYTOCIN BOLUS FROM INFUSION: 500.0000 mL | INTRAVENOUS | Status: DC

## 2015-11-21 MED ORDER — ONDANSETRON HCL 4 MG PO TABS: 4.0000 mg | ORAL_TABLET | ORAL | Status: DC | PRN

## 2015-11-21 MED ORDER — PROMETHAZINE HCL 25 MG/ML IJ SOLN: 12.5000 mg | INTRAMUSCULAR | Status: DC | PRN

## 2015-11-21 MED ORDER — BENZOCAINE-MENTHOL 20-0.5 % EX AERO
1.0000 "application " | INHALATION_SPRAY | CUTANEOUS | Status: DC | PRN
  Filled 2015-11-21: qty 56

## 2015-11-21 MED ORDER — FENTANYL 2.5 MCG/ML BUPIVACAINE 1/10 % EPIDURAL INFUSION (WH - ANES)
14.0000 mL/h | INTRAMUSCULAR | Status: DC | PRN
  Administered 2015-11-21 (×2): 14 mL/h via EPIDURAL
  Filled 2015-11-21 (×2): qty 125

## 2015-11-21 MED ORDER — SIMETHICONE 80 MG PO CHEW: 80.0000 mg | CHEWABLE_TABLET | ORAL | Status: DC | PRN

## 2015-11-21 MED ORDER — DIPHENHYDRAMINE HCL 25 MG PO CAPS: 25.0000 mg | ORAL_CAPSULE | Freq: Four times a day (QID) | ORAL | Status: DC | PRN

## 2015-11-21 MED ORDER — SENNOSIDES-DOCUSATE SODIUM 8.6-50 MG PO TABS
2.0000 | ORAL_TABLET | ORAL | Status: DC
  Administered 2015-11-21 – 2015-11-22 (×2): 2 via ORAL
  Filled 2015-11-21 (×2): qty 2

## 2015-11-21 MED ORDER — PRENATAL MULTIVITAMIN CH
1.0000 | ORAL_TABLET | Freq: Every day | ORAL | Status: DC
  Administered 2015-11-22: 1 via ORAL
  Filled 2015-11-21: qty 1

## 2015-11-21 MED ORDER — IBUPROFEN 600 MG PO TABS
600.0000 mg | ORAL_TABLET | Freq: Four times a day (QID) | ORAL | Status: DC
  Administered 2015-11-21 – 2015-11-23 (×6): 600 mg via ORAL
  Filled 2015-11-21 (×7): qty 1

## 2015-11-21 MED ORDER — ONDANSETRON HCL 4 MG/2ML IJ SOLN: 4.0000 mg | Freq: Four times a day (QID) | INTRAMUSCULAR | Status: DC | PRN

## 2015-11-21 MED ORDER — ACETAMINOPHEN 325 MG PO TABS
650.0000 mg | ORAL_TABLET | ORAL | Status: DC | PRN
  Administered 2015-11-22: 650 mg via ORAL
  Filled 2015-11-21: qty 2

## 2015-11-21 MED ORDER — OXYCODONE-ACETAMINOPHEN 5-325 MG PO TABS
1.0000 | ORAL_TABLET | ORAL | Status: DC | PRN
Start: 2015-11-21 — End: 2015-11-23

## 2015-11-21 MED ORDER — ONDANSETRON HCL 4 MG/2ML IJ SOLN
4.0000 mg | INTRAMUSCULAR | Status: DC | PRN
  Administered 2015-11-21: 4 mg via INTRAVENOUS
  Filled 2015-11-21: qty 2

## 2015-11-21 MED ORDER — OXYCODONE-ACETAMINOPHEN 5-325 MG PO TABS: 2.0000 | ORAL_TABLET | ORAL | Status: DC | PRN

## 2015-11-21 MED ORDER — OXYTOCIN 10 UNIT/ML IJ SOLN
2.5000 [IU]/h | INTRAVENOUS | Status: DC
  Administered 2015-11-21: 2.5 [IU]/h via INTRAVENOUS
  Filled 2015-11-21: qty 4

## 2015-11-21 MED ORDER — LIDOCAINE HCL (PF) 1 % IJ SOLN
30.0000 mL | INTRAMUSCULAR | Status: DC | PRN
  Filled 2015-11-21: qty 30

## 2015-11-21 MED ORDER — CITRIC ACID-SODIUM CITRATE 334-500 MG/5ML PO SOLN: 30.0000 mL | ORAL | Status: DC | PRN

## 2015-11-21 MED ORDER — LACTATED RINGERS IV SOLN
500.0000 mL | INTRAVENOUS | Status: DC | PRN
  Administered 2015-11-21: 500 mL via INTRAVENOUS

## 2015-11-21 NOTE — H&P (Signed)
Shelley Conner is a 25 y.o. female G1P0 with IUP at [redacted]w[redacted]d presenting for contractions. Pt states she has been having regular, every 3-4 minutes contractions, associated with spotting vaginal bleeding for 8 hours..  Membranes are intact, with active fetal movement.   [facility] at [facility].  Was seen earlier in MAU and sent home in early labor.  Returns with more frequent and painful ctx.  Prenatal History/Complications:  none  Past Medical History: Past Medical History  Diagnosis Date  . Medical history non-contributory     Past Surgical History: Past Surgical History  Procedure Laterality Date  . No past surgeries    . Wisdom tooth extraction      Obstetrical History: OB History    Gravida Para Term Preterm AB TAB SAB Ectopic Multiple Living   1                Social History: Social History   Social History  . Marital Status: Married    Spouse Name: N/A  . Number of Children: N/A  . Years of Education: N/A   Social History Main Topics  . Smoking status: Never Smoker   . Smokeless tobacco: Never Used  . Alcohol Use: No  . Drug Use: No  . Sexual Activity: Not Currently   Other Topics Concern  . None   Social History Narrative    Family History: History reviewed. No pertinent family history.  Allergies: No Known Allergies  Prescriptions prior to admission  Medication Sig Dispense Refill Last Dose  . Prenatal Vit-Fe Fumarate-FA (PRENATAL MULTIVITAMIN) TABS tablet Take 1 tablet by mouth daily at 12 noon.   Taking     Prenatal Transfer Tool  Maternal Diabetes: No Genetic Screening: Normal Maternal Ultrasounds/Referrals: Normal Fetal Ultrasounds or other Referrals:  None Maternal Substance Abuse:  No Significant Maternal Medications:  None Significant Maternal Lab Results: None     Review of Systems   Constitutional: Negative for fever and chills Eyes: Negative for visual disturbances Respiratory: Negative for shortness of breath, dyspnea Cardiovascular:  Negative for chest pain or palpitations  Gastrointestinal: Negative for vomiting, diarrhea and constipation.  POSITIVE for abdominal pain (contractions) Genitourinary: Negative for dysuria and urgency Musculoskeletal: Negative for back pain, joint pain, myalgias  Neurological: Negative for dizziness and headaches      Blood pressure 107/72, pulse 84, temperature 97.9 F (36.6 C), temperature source Oral, resp. rate 18, height 5\' 1"  (1.549 m), weight 64.864 kg (143 lb), last menstrual period 02/05/2015, SpO2 100 %. General appearance: alert, cooperative and no distress Lungs: clear to auscultation bilaterally Heart: regular rate and rhythm Abdomen: soft, non-tender; bowel sounds normal Extremities: Homans sign is negative, no sign of DVT DTR's 2+ Presentation: cephalic Fetal monitoring  Baseline: 120 bpm, Variability: Good {> 6 bpm), Accelerations: Reactive and Decelerations: Absent Uterine activity  2 minutes  Dilation: 4 Effacement (%): 90, 100 Station: -1 Exam by:: Irving Burton Rothermel RN    Prenatal labs: ABO, Rh: --/--/O POS (01/06 0330) Antibody: NEG (01/06 0330) Rubella: !Error!immune RPR: Nonreactive (06/13 0000)  HBsAg: Negative (06/13 0000)  HIV: Non-reactive (06/13 0000)  GBS: Negative (12/14 0000)  1 hr Glucola 130 Genetic screening  Normal NT/IT Anatomy US normal   Results for orders placed or performed during the hospital encounter of 11/21/15 (from the past 24 hour(s))  CBC   Collection Time: 11/21/15  3:30 AM  Result Value Ref Range   WBC 16.7 (H) 4.0 - 10.5 K/uL   RBC 4.37 3.87 - 5.11 MIL/uL  Hemoglobin 13.1 12.0 - 15.0 g/dL   HCT 16.139.3 09.636.0 - 04.546.0 %   MCV 89.9 78.0 - 100.0 fL   MCH 30.0 26.0 - 34.0 pg   MCHC 33.3 30.0 - 36.0 g/dL   RDW 40.913.6 81.111.5 - 91.415.5 %   Platelets 285 150 - 400 K/uL  Type and screen [facility]   Collection Time: 11/21/15  3:30 AM  Result Value Ref Range   ABO/RH(D) O POS    Antibody Screen NEG    Sample  Expiration 11/24/2015     Assessment: Shelley Conner is a 25 y.o. G1P0 with an IUP at 8784w5d presenting for early labor  Plan: #Labor: expectant management #Pain:  Per request #FWB Cat 1 #ID: GBS: neg  #MOF:  Breast/bottle  CRESENZO-DISHMAN,Emaleigh Guimond 11/21/2015, 6:16 AM

## 2015-11-21 NOTE — Anesthesia Postprocedure Evaluation (Signed)
Anesthesia Post Note  Patient: Shelley Conner  Procedure(s) Performed: * No procedures listed *  Patient location during evaluation: Mother Baby Anesthesia Type: Epidural Level of consciousness: awake Pain management: pain level controlled Vital Signs Assessment: post-procedure vital signs reviewed and stable Respiratory status: spontaneous breathing Cardiovascular status: stable Postop Assessment: no headache, no backache, epidural receding, patient able to bend at knees, no signs of nausea or vomiting and adequate PO intake Anesthetic complications: no    Last Vitals:  Filed Vitals:   11/21/15 1445 11/21/15 1545  BP: 104/64 98/61  Pulse: 85 82  Temp: 36.9 C 36.9 C  Resp: 20 18    Last Pain:  Filed Vitals:   11/21/15 1553  PainSc: 0-No pain                 [name]

## 2015-11-21 NOTE — Anesthesia Preprocedure Evaluation (Signed)

## 2015-11-21 NOTE — Lactation Note (Signed)
This note was copied from the chart of Shelley Conner Theilen. Lactation Consultation Note  Initial visit made.  FOB able to interpret.  Mom is very sleepy.  Baby is 2 hours old and breastfed after delivery in birthing suites.  Reviewed feeding cues and instructed to put baby to breast with any cue.  Mom denies questions.  Instructed to call with latch assist or concerns prn.  Patient Name: Shelley Conner Cowles Today's Date: 11/21/2015 Reason for consult: Initial assessment   Maternal Data Formula Feeding for Exclusion: Yes Reason for exclusion: Mother's choice to formula and breast feed on admission Does the patient have breastfeeding experience prior to this delivery?: No  Feeding Feeding Type: Breast Fed  LATCH Score/Interventions                      Lactation Tools Discussed/Used     Consult Status Consult Status: Follow-up Date: 11/22/15 Follow-up type: In-patient    Huston FoleyMOULDEN, Desmon Hitchner S 11/21/2015, 3:36 PM

## 2015-11-21 NOTE — MAU Note (Signed)
PT  WAS  HERE EARLIER-  HOME  AT 1030-  RETURNED    HURTING  WORSE.

## 2015-11-21 NOTE — Anesthesia Procedure Notes (Signed)
Epidural Patient location during procedure: OB Start time: 11/21/2015 4:34 AM End time: 11/21/2015 4:38 AM  Staffing Anesthesiologist: Leilani AbleHATCHETT, Khole Branch Performed by: anesthesiologist   Preanesthetic Checklist Completed: patient identified, surgical consent, pre-op evaluation, timeout performed, IV checked, risks and benefits discussed and monitors and equipment checked  Epidural Patient position: sitting Prep: site prepped and draped and DuraPrep Patient monitoring: continuous pulse ox and blood pressure Approach: midline Location: L3-L4 Injection technique: LOR air  Needle:  Needle type: Tuohy  Needle gauge: 17 G Needle length: 9 cm and 9 Needle insertion depth: 4 cm Catheter type: closed end flexible Catheter size: 19 Gauge Catheter at skin depth: 9 cm Test dose: negative and Other  Assessment Sensory level: T9 Events: blood not aspirated, injection not painful, no injection resistance, negative IV test and no paresthesia  Additional Notes Reason for block:procedure for pain

## 2015-11-22 LAB — RPR: RPR Ser Ql: NONREACTIVE

## 2015-11-22 NOTE — Lactation Note (Signed)
This note was copied from the chart of Shelley Catalina LungerHai Cattell. Lactation Consultation Note; Mom had baby latched to breast when I went in. Dad translated for me. Assisted mom with getting baby turned tummy to tummy and deeper latch. Mom reports no pain with latch. No questions at present. To call for assist prn  Patient Name: Shelley Conner EAVWU'JToday's Date: 11/22/2015 Reason for consult: Follow-up assessment   Maternal Data Formula Feeding for Exclusion: Yes Reason for exclusion: Mother's choice to formula and breast feed on admission Does the patient have breastfeeding experience prior to this delivery?: No  Feeding Feeding Type: Breast Fed  LATCH Score/Interventions Latch: Grasps breast easily, tongue down, lips flanged, rhythmical sucking.  Audible Swallowing: A few with stimulation  Type of Nipple: Everted at rest and after stimulation  Comfort (Breast/Nipple): Soft / non-tender     Hold (Positioning): Assistance needed to correctly position infant at breast and maintain latch. Intervention(s): Breastfeeding basics reviewed  LATCH Score: 8  Lactation Tools Discussed/Used     Consult Status Consult Status: PRN    Pamelia HoitWeeks, Myndi Wamble D 11/22/2015, 2:22 PM

## 2015-11-22 NOTE — Progress Notes (Signed)
Post Partum Day 1 Subjective: Eating, drinking, voiding, ambulating well.  +flatus.  Lochia and pain wnl.  Denies dizziness, lightheadedness, or sob. No complaints.   Objective: Blood pressure 94/54, pulse 90, temperature 97.6 F (36.4 C), temperature source Oral, resp. rate 18, height 5\' 1"  (1.549 m), weight 64.864 kg (143 lb), last menstrual period 02/05/2015, SpO2 99 %, unknown if currently breastfeeding.  Physical Exam:  General: alert, cooperative and no distress Lochia: appropriate Uterine Fundus: firm Incision: n/a DVT Evaluation: No evidence of DVT seen on physical exam. Negative Homan's sign. No cords or calf tenderness. No significant calf/ankle edema.   Recent Labs  11/21/15 0330  HGB 13.1  HCT 39.3    Assessment/Plan: Plan for discharge tomorrow  Breast/bottle Contraception- undecided   LOS: 1 day   Marge DuncansBooker, Pollie Poma Randall 11/22/2015, 9:26 AM

## 2015-11-23 MED ORDER — IBUPROFEN 600 MG PO TABS: 600.0000 mg | ORAL_TABLET | Freq: Four times a day (QID) | ORAL | Status: DC | PRN

## 2015-11-23 NOTE — Discharge Summary (Signed)
OB Discharge Summary     Patient Name: Shelley Conner DOB: 04/04/1991 MRN: [id number]  Date of admission: 11/21/2015 Delivering MD: Illene BolusLEMMONS, LORI A   Date of discharge: 11/23/2015  Admitting diagnosis: 40 WEEKS CTX Intrauterine pregnancy: 2630w5d     Secondary diagnosis:  Active Problems:   Active labor  Additional problems: none     Discharge diagnosis: Term Pregnancy Delivered                                                                                                Post partum procedures:none  Augmentation: none  Complications: None  Hospital course:  Onset of Labor With Vaginal Delivery     25 y.o. yo G1P1001 at 6630w5d was admitted in Latent Labor on 11/21/2015. Patient had an uncomplicated labor course as follows:  Membrane Rupture Time/Date: 9:50 AM ,11/21/2015   Intrapartum Procedures: Episiotomy: None [1]                                         Lacerations:  2nd degree [3];Perineal [11]  Patient had a delivery of a Viable infant. 11/21/2015  Information for the patient's newborn:  Sabino Niemannguyen, Boy Maaliyah [[id number]][[id number]]  Delivery Method: Vag-Spont    Pateint had an uncomplicated postpartum course.  She is ambulating, tolerating a regular diet, passing flatus, and urinating well. Patient is discharged home in stable condition on 11/23/2015.    Physical exam  Filed Vitals:   11/21/15 2100 11/22/15 0548 11/22/15 1731 11/23/15 0520  BP: 106/70 94/54 110/58 109/70  Pulse: 96 90 82 79  Temp: 98.5 F (36.9 C) 97.6 F (36.4 C) 98.1 F (36.7 C) 97.9 F (36.6 C)  TempSrc: Oral Oral Oral Oral  Resp: 18 18 18 18   Height:      Weight:      SpO2:       General: alert, cooperative and no distress Lochia: appropriate Uterine Fundus: firm Incision: N/A DVT Evaluation: No evidence of DVT seen on physical exam. Negative Homan's sign. No cords or calf tenderness. No significant calf/ankle edema. Labs: Lab Results  Component Value Date   WBC 16.7* 11/21/2015   HGB 13.1 11/21/2015   HCT 39.3 11/21/2015   MCV 89.9 11/21/2015   PLT 285 11/21/2015   CMP Latest Ref Rng 03/25/2015  Glucose 70 - 99 mg/dL 84(O62(L)  BUN 6 - 23 mg/dL 10  Creatinine 9.620.50 - 9.521.10 mg/dL 8.410.51  Sodium 324135 - 401145 mEq/L 137  Potassium 3.5 - 5.3 mEq/L 4.4  Chloride 96 - 112 mEq/L 104  CO2 19 - 32 mEq/L 21  Calcium 8.4 - 10.5 mg/dL 9.6  Total Protein 6.0 - 8.3 g/dL 7.2  Total Bilirubin 0.2 - 1.2 mg/dL 0.4  Alkaline Phos 39 - 117 U/L 52  AST 0 - 37 U/L 13  ALT 0 - 35 U/L 8    Discharge instruction: per After Visit Summary and "Baby and Me Booklet".  After visit meds:    Medication List    TAKE these medications  ibuprofen 600 MG tablet  Commonly known as:  ADVIL,MOTRIN  Take 1 tablet (600 mg total) by mouth every 6 (six) hours as needed for mild pain, moderate pain or cramping.     prenatal multivitamin Tabs tablet  Take 1 tablet by mouth daily at 12 noon.        Diet: routine diet  Activity: Advance as tolerated. Pelvic rest for 6 weeks.   Outpatient follow up:6 weeks Follow up Appt:No future appointments. Follow up Visit:No Follow-up on file.  Postpartum contraception: Progesterone only pills, abstinence until pp visit  Newborn Data: Live born female  Birth Weight: 6 lb 14.2 oz (3125 g) APGAR: 9, 9  Baby Feeding: br/bottle Disposition:home with mother   11/23/2015 Marge Duncans, CNM

## 2015-11-23 NOTE — Lactation Note (Signed)
This note was copied from the chart of Boy Catalina LungerHai Hollars. Lactation Consultation Note  Patient Name: Boy Catalina LungerHai Fetterly ZOXWR'UToday's Date: 11/23/2015 Reason for consult: Follow-up assessment;Breast/nipple pain (no stool in >24 hours)   Follow up with parents prior to d/c. Dad served as Equities traderinterpreter. Infant with 12 BF for 10-40 minutes, 3 voids, and 0 stools. Infant with a large brown stool while I was in room. Infant weight 6 lb 10.9 oz with a 3 % weight loss since birth. Mom with small breasts and large everted nipples. Breasts are filling today. Mom placed infant to breast, he tended to sleep more than eat, encouraged mom to stimulate to promote active feeding while at breast and to massage/compress breasts with feedings. Engorgement prevention reviewed with mom. There is nipple tenderness with latch . Nipple Care discussed.Assisted mom in obtaining a deeper latch with infant. Reviewed all BF information in Taking Care of Baby and Me Booklet. [facility] and [facility] Gave mom am hand pump with instructions for use. Advised mom to call WIC to set up an appointment after d/c. All questions answered. Parents did ask if infant could have water, told them no, only BM or Formula, discussed composition of BM. Reviewed LC Brochure, encouraged family to call with questions/concerns.    Maternal Data Formula Feeding for Exclusion: No Has patient been taught Hand Expression?: Yes Does the patient have breastfeeding experience prior to this delivery?: No  Feeding Feeding Type: Breast Fed Length of feed: 10 min  LATCH Score/Interventions Latch: Repeated attempts needed to sustain latch, nipple held in mouth throughout feeding, stimulation needed to elicit sucking reflex. Intervention(s): Adjust position;Assist with latch;Breast massage;Breast compression  Audible Swallowing: A few with stimulation Intervention(s): Hand expression;Alternate breast massage;Skin to  skin  Type of Nipple: Everted at rest and after stimulation  Comfort (Breast/Nipple): Filling, red/small blisters or bruises, mild/mod discomfort  Problem noted: Mild/Moderate discomfort Interventions (Mild/moderate discomfort): Hand expression (EBM to nipples post feed)  Hold (Positioning): Assistance needed to correctly position infant at breast and maintain latch. Intervention(s): Breastfeeding basics reviewed;Support Pillows;Position options;Skin to skin  LATCH Score: 6  Lactation Tools Discussed/Used WIC Program: Yes Pump Review: Setup, frequency, and cleaning;Milk Storage   Consult Status Consult Status: Complete Follow-up type: Call as needed    Ed BlalockSharon S Ailed Defibaugh 11/23/2015, 10:57 AM

## 2015-11-23 NOTE — Discharge Instructions (Signed)
NO SEX UNTIL AFTER YOU GET YOUR BIRTH CONTROL  ° °Postpartum Care After Vaginal Delivery °After you deliver your newborn (postpartum period), the usual stay in the hospital is 24-72 hours. If there were problems with your labor or delivery, or if you have other medical problems, you might be in the hospital longer.  °While you are in the hospital, you will receive help and instructions on how to care for yourself and your newborn during the postpartum period.  °While you are in the hospital: °· Be sure to tell your nurses if you have pain or discomfort, as well as where you feel the pain and what makes the pain worse. °· If you had an incision made near your vagina (episiotomy) or if you had some tearing during delivery, the nurses may put ice packs on your episiotomy or tear. The ice packs may help to reduce the pain and swelling. °· If you are breastfeeding, you may feel uncomfortable contractions of your uterus for a couple of weeks. This is normal. The contractions help your uterus get back to normal size. °· It is normal to have some bleeding after delivery. °· For the first 1-3 days after delivery, the flow is red and the amount may be similar to a period. °· It is common for the flow to start and stop. °· In the first few days, you may pass some small clots. Let your nurses know if you begin to pass large clots or your flow increases. °· Do not  flush blood clots down the toilet before having the nurse look at them. °· During the next 3-10 days after delivery, your flow should become more watery and pink or brown-tinged in color. °· Ten to fourteen days after delivery, your flow should be a small amount of yellowish-white discharge. °· The amount of your flow will decrease over the first few weeks after delivery. Your flow may stop in 6-8 weeks. Most women have had their flow stop by 12 weeks after delivery. °· You should change your sanitary pads frequently. °· Wash your hands thoroughly with soap and water  for at least 20 seconds after changing pads, using the toilet, or before holding or feeding your newborn. °· You should feel like you need to empty your bladder within the first 6-8 hours after delivery. °· In case you become weak, lightheaded, or faint, call your nurse before you get out of bed for the first time and before you take a shower for the first time. °· Within the first few days after delivery, your breasts may begin to feel tender and full. This is called engorgement. Breast tenderness usually goes away within 48-72 hours after engorgement occurs. You may also notice milk leaking from your breasts. If you are not breastfeeding, do not stimulate your breasts. Breast stimulation can make your breasts produce more milk. °· Spending as much time as possible with your newborn is very important. During this time, you and your newborn can feel close and get to know each other. Having your newborn stay in your room (rooming in) will help to strengthen the bond with your newborn.  It will give you time to get to know your newborn and become comfortable caring for your newborn. °· Your hormones change after delivery. Sometimes the hormone changes can temporarily cause you to feel sad or tearful. These feelings should not last more than a few days. If these feelings last longer than that, you should talk to your caregiver. °· If desired,   talk to your caregiver about methods of family planning or contraception. °· Talk to your caregiver about immunizations. Your caregiver may want you to have the following immunizations before leaving the hospital: °· Tetanus, diphtheria, and pertussis (Tdap) or tetanus and diphtheria (Td) immunization. It is very important that you and your family (including grandparents) or others caring for your newborn are up-to-date with the Tdap or Td immunizations. The Tdap or Td immunization can help protect your newborn from getting ill. °· Rubella immunization. °· Varicella (chickenpox)  immunization. °· Influenza immunization. You should receive this annual immunization if you did not receive the immunization during your pregnancy. °  °This information is not intended to replace advice given to you by your health care provider. Make sure you discuss any questions you have with your health care provider. °  °Document Released: 08/29/2007 Document Revised: 07/26/2012 Document Reviewed: 06/28/2012 °Elsevier Interactive Patient Education ©2016 [employer] ° °Breastfeeding °Deciding to breastfeed is one of the best choices you can make for you and your baby. A change in hormones during pregnancy causes your breast tissue to grow and increases the number and size of your milk ducts. These hormones also allow proteins, sugars, and fats from your blood supply to make breast milk in your milk-producing glands. Hormones prevent breast milk from being released before your baby is born as well as prompt milk flow after birth. Once breastfeeding has begun, thoughts of your baby, as well as his or her sucking or crying, can stimulate the release of milk from your milk-producing glands.  °BENEFITS OF BREASTFEEDING °For Your Baby °· Your first milk (colostrum) helps your baby's digestive system function better. °· There are antibodies in your milk that help your baby fight off infections. °· Your baby has a lower incidence of asthma, allergies, and sudden infant death syndrome. °· The nutrients in breast milk are better for your baby than infant formulas and are designed uniquely for your baby's needs. °· Breast milk improves your baby's brain development. °· Your baby is less likely to develop other conditions, such as childhood obesity, asthma, or type 2 diabetes mellitus. °For You °· Breastfeeding helps to create a very special bond between you and your baby. °· Breastfeeding is convenient. Breast milk is always available at the correct temperature and costs nothing. °· Breastfeeding helps to burn calories  and helps you lose the weight gained during pregnancy. °· Breastfeeding makes your uterus contract to its prepregnancy size faster and slows bleeding (lochia) after you give birth.   °· Breastfeeding helps to lower your risk of developing type 2 diabetes mellitus, osteoporosis, and breast or ovarian cancer later in life. °SIGNS THAT YOUR BABY IS HUNGRY °Early Signs of Hunger °· Increased alertness or activity. °· Stretching. °· Movement of the head from side to side. °· Movement of the head and opening of the mouth when the corner of the mouth or cheek is stroked (rooting). °· Increased sucking sounds, smacking lips, cooing, sighing, or squeaking. °· Hand-to-mouth movements. °· Increased sucking of fingers or hands. °Late Signs of Hunger °· Fussing. °· Intermittent crying. °Extreme Signs of Hunger °Signs of extreme hunger will require calming and consoling before your baby will be able to breastfeed successfully. Do not wait for the following signs of extreme hunger to occur before you initiate breastfeeding: °· Restlessness. °· A loud, strong cry. °· Screaming. °BREASTFEEDING BASICS °Breastfeeding Initiation °· Find a comfortable place to sit or lie down, with your neck and back well supported. °· Place   a pillow or rolled up blanket under your baby to bring him or her to the level of your breast (if you are seated). Nursing pillows are specially designed to help support your arms and your baby while you breastfeed. °· Make sure that your baby's abdomen is facing your abdomen. °· Gently massage your breast. With your fingertips, massage from your chest wall toward your nipple in a circular motion. This encourages milk flow. You may need to continue this action during the feeding if your milk flows slowly. °· Support your breast with 4 fingers underneath and your thumb above your nipple. Make sure your fingers are well away from your nipple and your baby's mouth. °· Stroke your baby's lips gently with your finger or  nipple. °· When your baby's mouth is open wide enough, quickly bring your baby to your breast, placing your entire nipple and as much of the colored area around your nipple (areola) as possible into your baby's mouth. °¨ More areola should be visible above your baby's upper lip than below the lower lip. °¨ Your baby's tongue should be between his or her lower gum and your breast. °· Ensure that your baby's mouth is correctly positioned around your nipple (latched). Your baby's lips should create a seal on your breast and be turned out (everted). °· It is common for your baby to suck about 2-3 minutes in order to start the flow of breast milk. °Latching °Teaching your baby how to latch on to your breast properly is very important. An improper latch can cause nipple pain and decreased milk supply for you and poor weight gain in your baby. Also, if your baby is not latched onto your nipple properly, he or she may swallow some air during feeding. This can make your baby fussy. Burping your baby when you switch breasts during the feeding can help to get rid of the air. However, teaching your baby to latch on properly is still the best way to prevent fussiness from swallowing air while breastfeeding. °Signs that your baby has successfully latched on to your nipple: °· Silent tugging or silent sucking, without causing you pain. °· Swallowing heard between every 3-4 sucks. °· Muscle movement above and in front of his or her ears while sucking. °Signs that your baby has not successfully latched on to nipple: °· Sucking sounds or smacking sounds from your baby while breastfeeding. °· Nipple pain. °If you think your baby has not latched on correctly, slip your finger into the corner of your baby's mouth to break the suction and place it between your baby's gums. Attempt breastfeeding initiation again. °Signs of Successful Breastfeeding °Signs from your baby: °· A gradual decrease in the number of sucks or complete cessation of  sucking. °· Falling asleep. °· Relaxation of his or her body. °· Retention of a small amount of milk in his or her mouth. °· Letting go of your breast by himself or herself. °Signs from you: °· Breasts that have increased in firmness, weight, and size 1-3 hours after feeding. °· Breasts that are softer immediately after breastfeeding. °· Increased milk volume, as well as a change in milk consistency and color by the fifth day of breastfeeding. °· Nipples that are not sore, cracked, or bleeding. °Signs That Your Baby is Getting Enough Milk °· Wetting at least 3 diapers in a 24-hour period. The urine should be clear and pale yellow by age 5 days. °· At least 3 stools in a 24-hour period by age   5 days. The stool should be soft and yellow. °· At least 3 stools in a 24-hour period by age 7 days. The stool should be seedy and yellow. °· No loss of weight greater than 10% of birth weight during the first 3 days of age. °· Average weight gain of 4-7 ounces (113-198 g) per week after age 4 days. °· Consistent daily weight gain by age 5 days, without weight loss after the age of 2 weeks. °After a feeding, your baby may spit up a small amount. This is common. °BREASTFEEDING FREQUENCY AND DURATION °Frequent feeding will help you make more milk and can prevent sore nipples and breast engorgement. Breastfeed when you feel the need to reduce the fullness of your breasts or when your baby shows signs of hunger. This is called "breastfeeding on demand." Avoid introducing a pacifier to your baby while you are working to establish breastfeeding (the first 4-6 weeks after your baby is born). After this time you may choose to use a pacifier. Research has shown that pacifier use during the first year of a baby's life decreases the risk of sudden infant death syndrome (SIDS). °Allow your baby to feed on each breast as long as he or she wants. Breastfeed until your baby is finished feeding. When your baby unlatches or falls asleep while  feeding from the first breast, offer the second breast. Because newborns are often sleepy in the first few weeks of life, you may need to awaken your baby to get him or her to feed. °Breastfeeding times will vary from baby to baby. However, the following rules can serve as a guide to help you ensure that your baby is properly fed: °· Newborns (babies 4 weeks of age or younger) may breastfeed every 1-3 hours. °· Newborns should not go longer than 3 hours during the day or 5 hours during the night without breastfeeding. °· You should breastfeed your baby a minimum of 8 times in a 24-hour period until you begin to introduce solid foods to your baby at around 6 months of age. °BREAST MILK PUMPING °Pumping and storing breast milk allows you to ensure that your baby is exclusively fed your breast milk, even at times when you are unable to breastfeed. This is especially important if you are going back to work while you are still breastfeeding or when you are not able to be present during feedings. Your lactation consultant can give you guidelines on how long it is safe to store breast milk. °A breast pump is a machine that allows you to pump milk from your breast into a sterile bottle. The pumped breast milk can then be stored in a refrigerator or freezer. Some breast pumps are operated by hand, while others use electricity. Ask your lactation consultant which type will work best for you. Breast pumps can be purchased, but some hospitals and breastfeeding support groups lease breast pumps on a monthly basis. A lactation consultant can teach you how to hand express breast milk, if you prefer not to use a pump. °CARING FOR YOUR BREASTS WHILE YOU BREASTFEED °Nipples can become dry, cracked, and sore while breastfeeding. The following recommendations can help keep your breasts moisturized and healthy: °· Avoid using soap on your nipples. °· Wear a supportive bra. Although not required, special nursing bras and tank tops are  designed to allow access to your breasts for breastfeeding without taking off your entire bra or top. Avoid wearing underwire-style bras or extremely tight bras. °· Air dry   your nipples for 3-4 minutes after each feeding. °· Use only cotton bra pads to absorb leaked breast milk. Leaking of breast milk between feedings is normal. °· Use lanolin on your nipples after breastfeeding. Lanolin helps to maintain your skin's normal moisture barrier. If you use pure lanolin, you do not need to wash it off before feeding your baby again. Pure lanolin is not toxic to your baby. You may also hand express a few drops of breast milk and gently massage that milk into your nipples and allow the milk to air dry. °In the first few weeks after giving birth, some women experience extremely full breasts (engorgement). Engorgement can make your breasts feel heavy, warm, and tender to the touch. Engorgement peaks within 3-5 days after you give birth. The following recommendations can help ease engorgement: °· Completely empty your breasts while breastfeeding or pumping. You may want to start by applying warm, moist heat (in the shower or with warm water-soaked hand towels) just before feeding or pumping. This increases circulation and helps the milk flow. If your baby does not completely empty your breasts while breastfeeding, pump any extra milk after he or she is finished. °· Wear a snug bra (nursing or regular) or tank top for 1-2 days to signal your body to slightly decrease milk production. °· Apply ice packs to your breasts, unless this is too uncomfortable for you. °· Make sure that your baby is latched on and positioned properly while breastfeeding. °If engorgement persists after 48 hours of following these recommendations, contact your health care provider or a lactation consultant. °OVERALL HEALTH CARE RECOMMENDATIONS WHILE BREASTFEEDING °· Eat healthy foods. Alternate between meals and snacks, eating 3 of each per day. Because  what you eat affects your breast milk, some of the foods may make your baby more irritable than usual. Avoid eating these foods if you are sure that they are negatively affecting your baby. °· Drink milk, fruit juice, and water to satisfy your thirst (about 10 glasses a day). °· Rest often, relax, and continue to take your prenatal vitamins to prevent fatigue, stress, and anemia. °· Continue breast self-awareness checks. °· Avoid chewing and smoking tobacco. Chemicals from cigarettes that pass into breast milk and exposure to secondhand smoke may harm your baby. °· Avoid alcohol and drug use, including marijuana. °Some medicines that may be harmful to your baby can pass through breast milk. It is important to ask your health care provider before taking any medicine, including all over-the-counter and prescription medicine as well as vitamin and herbal supplements. °It is possible to become pregnant while breastfeeding. If birth control is desired, ask your health care provider about options that will be safe for your baby. °SEEK MEDICAL CARE IF: °· You feel like you want to stop breastfeeding or have become frustrated with breastfeeding. °· You have painful breasts or nipples. °· Your nipples are cracked or bleeding. °· Your breasts are red, tender, or warm. °· You have a swollen area on either breast. °· You have a fever or chills. °· You have nausea or vomiting. °· You have drainage other than breast milk from your nipples. °· Your breasts do not become full before feedings by the fifth day after you give birth. °· You feel sad and depressed. °· Your baby is too sleepy to eat well. °· Your baby is having trouble sleeping.   °· Your baby is wetting less than 3 diapers in a 24-hour period. °· Your baby has less than 3 stools in   a 24-hour period. °· Your baby's skin or the white part of his or her eyes becomes yellow.   °· Your baby is not gaining weight by 5 days of age. °SEEK IMMEDIATE MEDICAL CARE IF: °· Your baby  is overly tired (lethargic) and does not want to wake up and feed. °· Your baby develops an unexplained fever. °  °This information is not intended to replace advice given to you by your health care provider. Make sure you discuss any questions you have with your health care provider. °  °Document Released: 11/01/2005 Document Revised: 07/23/2015 Document Reviewed: 04/25/2013 °Elsevier Interactive Patient Education ©2016 [employer] Inc[employer] ° ° °

## 2015-11-24 NOTE — Progress Notes (Signed)
Post discharge chart review completed.  

## 2016-01-30 IMAGING — US US MFM FETAL NUCHAL TRANSLUCENCY
1 series · 13 of 28 positions shown · non-contrast
Comparison: none

[Series 1: us mfm fetal nuchal translucency · 0.07mm/px · 13 of 28 slices shown]
[im 2/28]
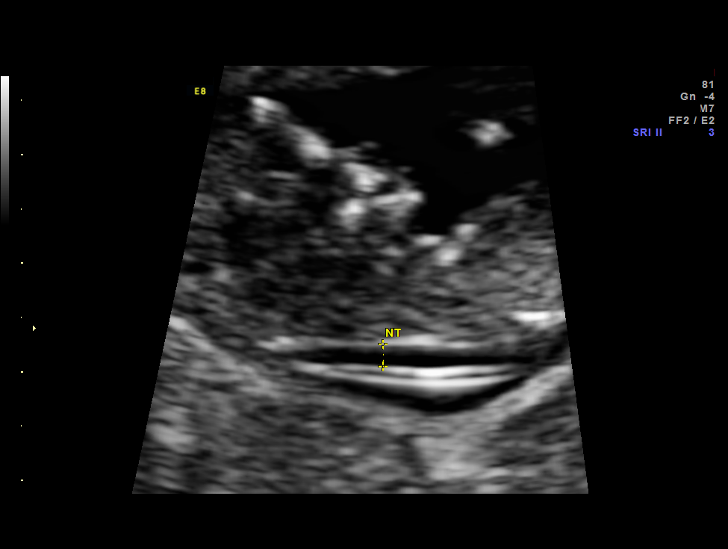
[im 4/28]
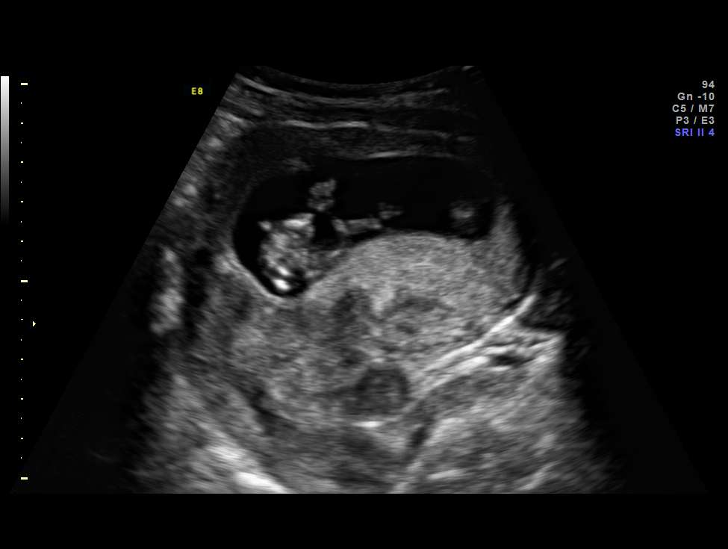
[im 6/28]
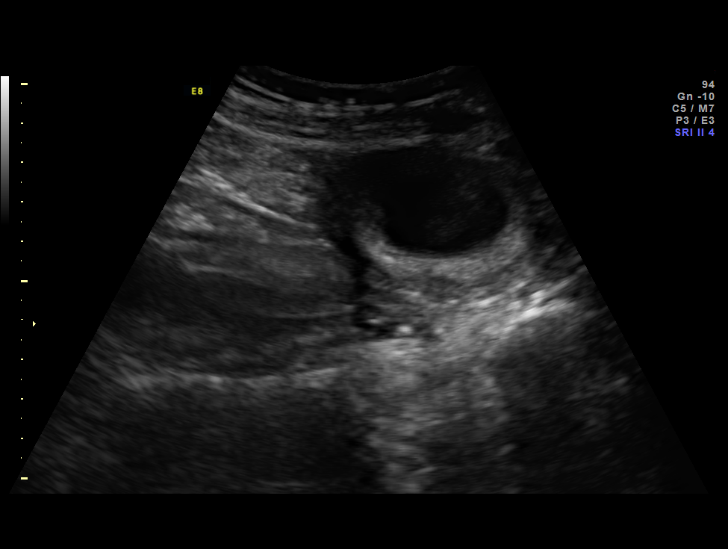
[im 8/28]
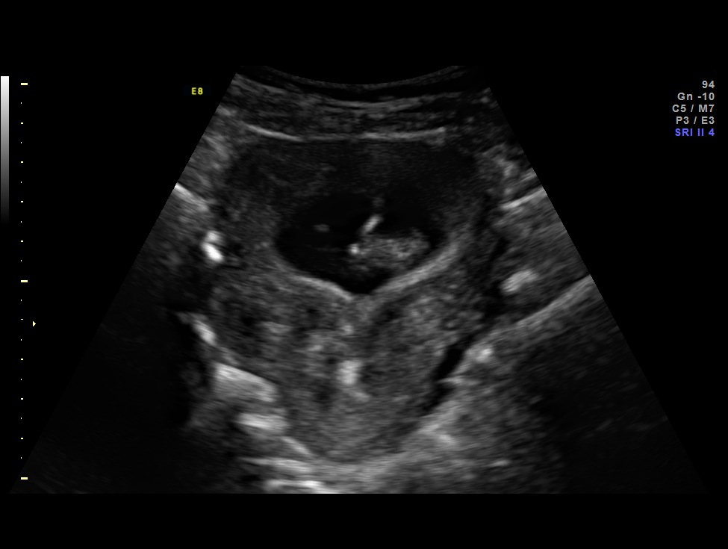
[im 10/28]
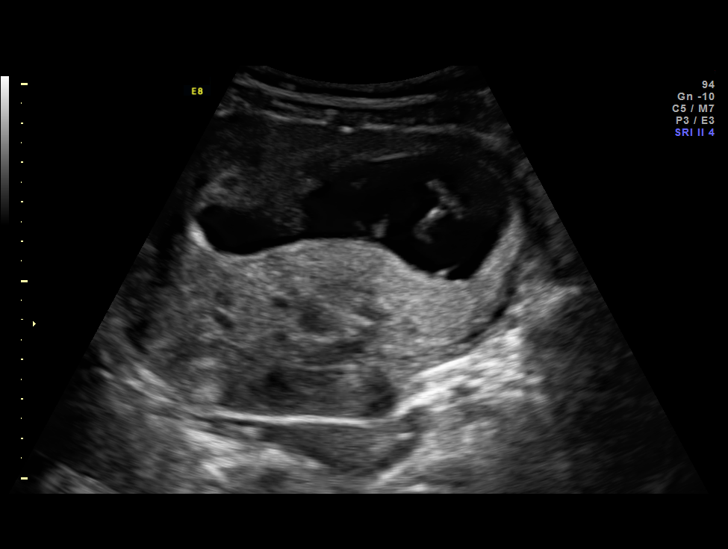
[im 12/28]
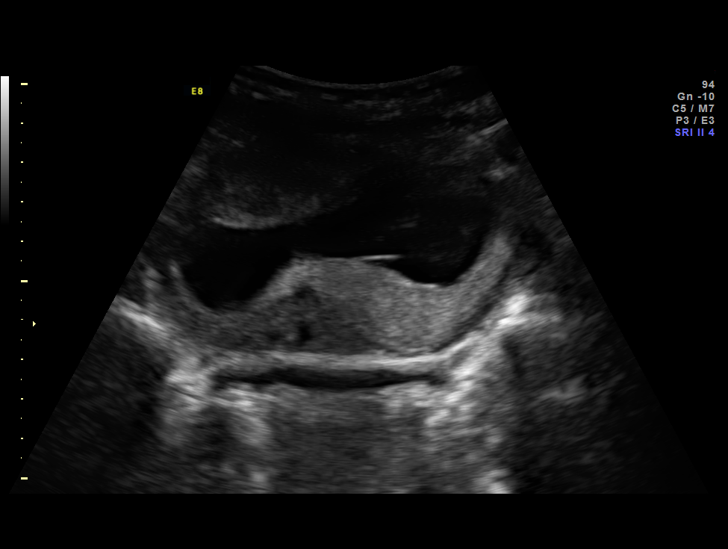
[im 15/28]
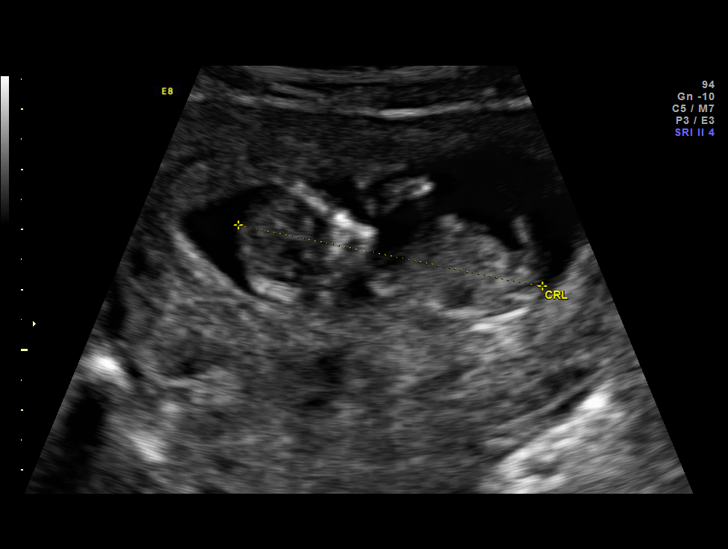
[im 17/28]
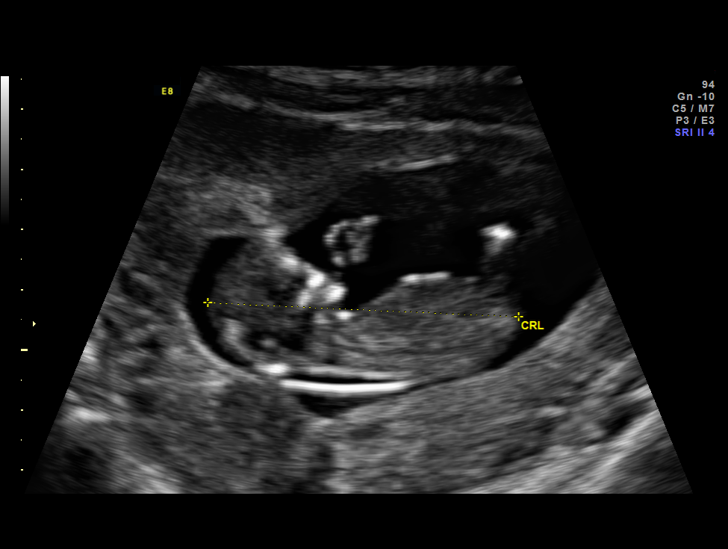
[im 19/28]
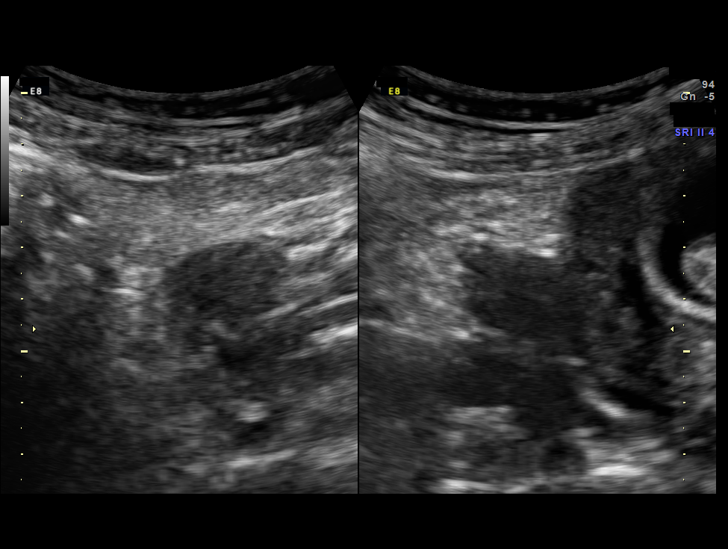
[im 21/28]
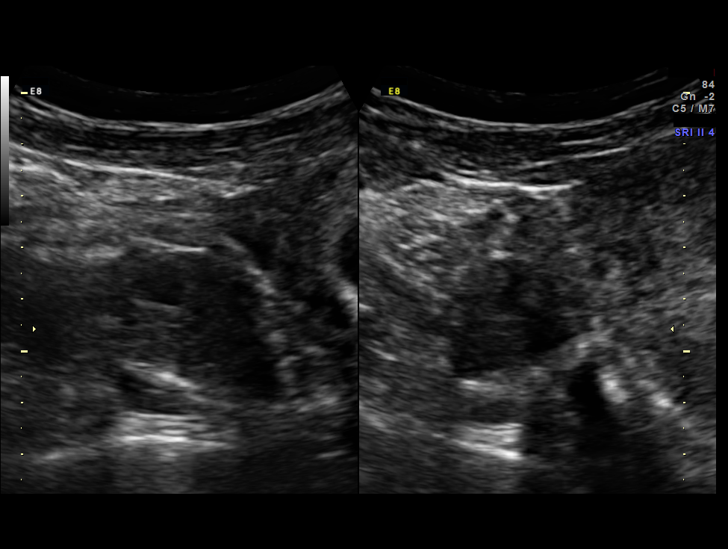
[im 23/28]
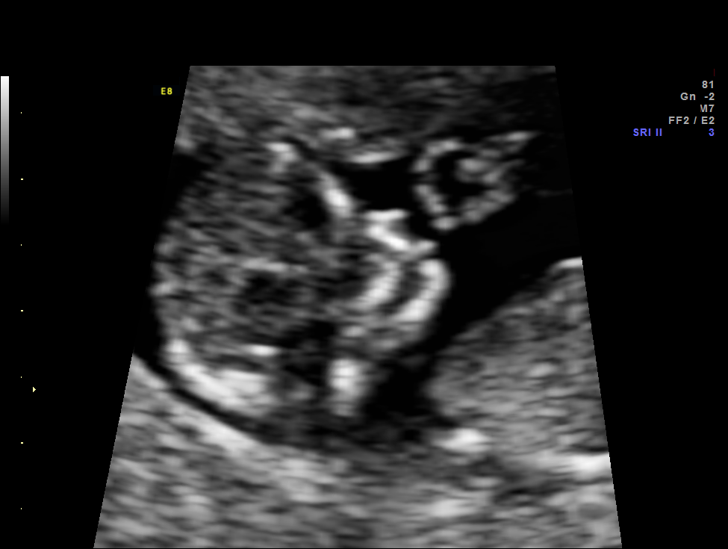
[im 25/28]
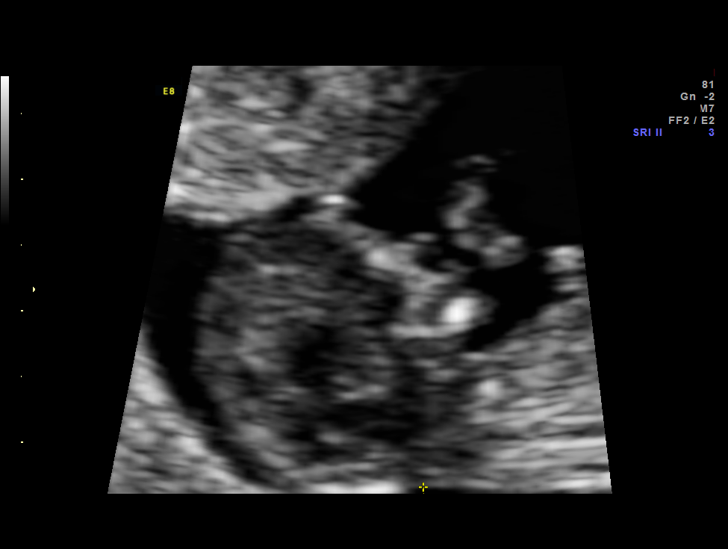
[im 27/28]
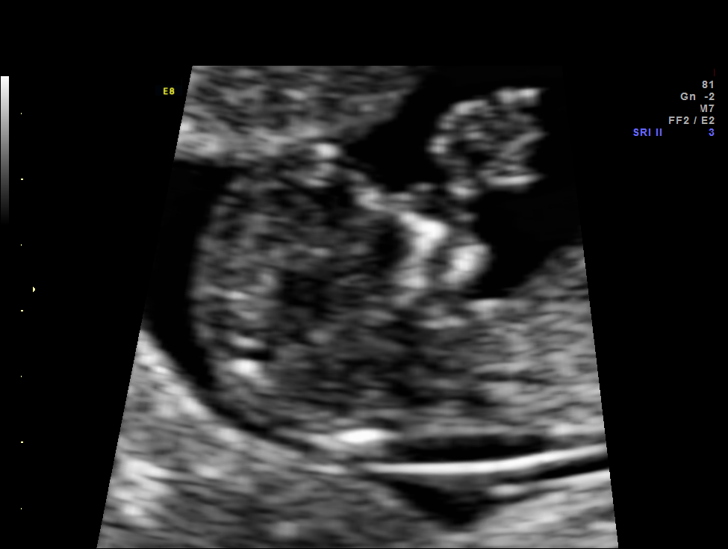

[13 of 28 positions shown; findings below may reference images not displayed]

OBSTETRICS REPORT
(Signed Final 05/09/2015 [DATE])

Service(s) Provided

Indications

First trimester aneuploidy screen (NT)                Z36
Uncertain LMP  Establish Gestational Age              Z36
11 weeks gestation of pregnancy
Fetal Evaluation

Num Of Fetuses:    1
Preg. Location:    Intrauterine
Gest. Sac:         Intrauterine
Fetal Pole:        Visualized
Fetal Heart Rate:  155                          bpm
Cardiac Activity:  Observed

Amniotic Fluid
AFI FV:      Subjectively within normal limits
Biometry

CRL:     51.7  mm     G. Age:  11w 5d                  EDD:   11/23/15

NT:        2.2 mm
Gestational Age

LMP:           13w 2d        Date:  02/05/15                 EDD:   11/12/15
Best:          11w 5d     Det. By:  U/S C R L (05/09/15)     EDD:   11/23/15
1st Trimester Genetic Sonogram Screening

CRL:            51.7  mm    G. Age:   11w 5d                 EDD:   11/23/15
Nuc Trans:       2.3  mm

Nasal Bone:                 Present
Cervix Uterus Adnexa

Cervix:       Closed
Uterus:       Single fibroid noted, see table below.
Cul De Sac:   No free fluid seen.

Left Ovary:    Within normal limits.
Right Ovary:   Within normal limits.
Adnexa:     No abnormality visualized.
Myomas

Site                     L(cm)      W(cm)       D(cm)      Location
Anterior                 3.1        3           3

Blood Flow                  RI       PI       Comments

Impression

SIUP at 11+5 weeks
No gross abnormalities identified
NT measurement was within normal limits for this GA; NB
present
Normal amniotic fluid volume
EDC based on today's measurements: 11/23/15
Fibroid uterus: see above for size and location
Recommendations

Offer MSAFP in the second trimester for ONTD screening
Offer anatomy U/S by 18 weeks

or concerns.

## 2018-09-04 ENCOUNTER — Other Ambulatory Visit (HOSPITAL_COMMUNITY): Payer: Self-pay | Admitting: Nurse Practitioner

## 2018-09-04 DIAGNOSIS — Z3A13 13 weeks gestation of pregnancy: Secondary | ICD-10-CM

## 2018-09-04 DIAGNOSIS — Z369 Encounter for antenatal screening, unspecified: Secondary | ICD-10-CM

## 2018-09-04 LAB — OB RESULTS CONSOLE RUBELLA ANTIBODY, IGM: Rubella: IMMUNE

## 2018-09-04 LAB — OB RESULTS CONSOLE GC/CHLAMYDIA
Chlamydia: NEGATIVE
Gonorrhea: NEGATIVE

## 2018-09-14 ENCOUNTER — Encounter (HOSPITAL_COMMUNITY): Payer: Self-pay

## 2018-09-18 ENCOUNTER — Encounter (HOSPITAL_COMMUNITY): Payer: Self-pay | Admitting: *Deleted

## 2018-09-18 LAB — OB RESULTS CONSOLE GBS: GBS: POSITIVE

## 2018-09-21 ENCOUNTER — Encounter (HOSPITAL_COMMUNITY): Payer: Self-pay

## 2018-09-21 ENCOUNTER — Ambulatory Visit (HOSPITAL_COMMUNITY)
Admission: RE | Admit: 2018-09-21 | Discharge: 2018-09-21 | Disposition: A | Discharge status: Home / Self Care | Payer: Medicaid Other | Source: Ambulatory Visit | Attending: Nurse Practitioner | Admitting: Nurse Practitioner

## 2018-09-21 ENCOUNTER — Other Ambulatory Visit (HOSPITAL_COMMUNITY): Payer: Self-pay

## 2018-09-21 DIAGNOSIS — Z369 Encounter for antenatal screening, unspecified: Secondary | ICD-10-CM

## 2018-09-21 DIAGNOSIS — Z3682 Encounter for antenatal screening for nuchal translucency: Secondary | ICD-10-CM

## 2018-09-21 DIAGNOSIS — Z3A13 13 weeks gestation of pregnancy: Secondary | ICD-10-CM | POA: Diagnosis not present

## 2018-09-27 LAB — OB RESULTS CONSOLE HIV ANTIBODY (ROUTINE TESTING): HIV: NONREACTIVE

## 2018-09-28 ENCOUNTER — Other Ambulatory Visit (HOSPITAL_COMMUNITY): Payer: Self-pay

## 2018-11-15 NOTE — L&D Delivery Note (Signed)
Patient is a 28 y.o. now G2P2 s/p NSVD at [redacted]w[redacted]d, who was admitted for SOL.  She progressed with augmentation (AROM) to complete and pushed 30 minutes to deliver. Head and shoulders delivered atraumatic. Spontaneous cry on delivery. Cord clamping delayed by several minutes then clamped by CNM and cut by FOB.    Retained placenta, after active management. Dr Macon Large called to bedside. Exaggerated fundal massage performed to delivery placenta, curettage with Banjo after delivery, scant membranes obtained with Banjo. (Performed by Dr Macon Large).   2nd degree laceration repaired without difficulty by CNM.  Mom and baby stable prior to transfer to postpartum. She plans on breastfeeding. She is undecided of method for birth control.  Delivery Note At 1:40 PM a viable and healthy female was delivered via Vaginal, Spontaneous (Presentation: LOA).  APGAR: 9, 9; weight pending.   Retained placenta, after active management. Dr Macon Large called to bedside. Exaggerated fundal massage performed to delivery placenta, curettage with Banjo after delivery, scant membranes obtained with Banjo. (Performed by Dr Macon Large). 3VCord:with the following complications: retained placenta  Anesthesia:  Epidural Episiotomy: None Lacerations: 2nd degree Suture Repair: 2.0 vicryl Est. Blood Loss (mL):   Mom to postpartum.  Baby to Couplet care / Skin to Skin.  Sharyon Cable CNM 03/15/2019, 2:30 PM

## 2018-12-07 ENCOUNTER — Inpatient Hospital Stay (HOSPITAL_COMMUNITY)
Admission: AD | Admit: 2018-12-07 | Discharge: 2018-12-07 | Disposition: A | Discharge status: Home / Self Care | Payer: Medicaid Other | Attending: Obstetrics & Gynecology | Admitting: Obstetrics & Gynecology

## 2018-12-07 ENCOUNTER — Encounter (HOSPITAL_COMMUNITY): Payer: Self-pay

## 2018-12-07 DIAGNOSIS — Z3A24 24 weeks gestation of pregnancy: Secondary | ICD-10-CM | POA: Insufficient documentation

## 2018-12-07 DIAGNOSIS — O4702 False labor before 37 completed weeks of gestation, second trimester: Secondary | ICD-10-CM

## 2018-12-07 DIAGNOSIS — R109 Unspecified abdominal pain: Secondary | ICD-10-CM | POA: Diagnosis present

## 2018-12-07 DIAGNOSIS — O2342 Unspecified infection of urinary tract in pregnancy, second trimester: Secondary | ICD-10-CM | POA: Insufficient documentation

## 2018-12-07 DIAGNOSIS — R82998 Other abnormal findings in urine: Secondary | ICD-10-CM | POA: Diagnosis not present

## 2018-12-07 LAB — URINALYSIS, ROUTINE W REFLEX MICROSCOPIC
BACTERIA UA: NONE SEEN
BILIRUBIN URINE: NEGATIVE
Glucose, UA: NEGATIVE mg/dL
KETONES UR: NEGATIVE mg/dL
NITRITE: NEGATIVE
Protein, ur: NEGATIVE mg/dL
Specific Gravity, Urine: 1.001 — ABNORMAL LOW (ref 1.005–1.030)
pH: 6 (ref 5.0–8.0)

## 2018-12-07 LAB — FETAL FIBRONECTIN: FETAL FIBRONECTIN: NEGATIVE

## 2018-12-07 MED ORDER — NIFEDIPINE 10 MG PO CAPS
10.0000 mg | ORAL_CAPSULE | ORAL | Status: DC | PRN
  Administered 2018-12-07 (×2): 10 mg via ORAL
  Filled 2018-12-07 (×2): qty 1

## 2018-12-07 MED ORDER — CEPHALEXIN 500 MG PO CAPS: 500.0000 mg | ORAL_CAPSULE | Freq: Four times a day (QID) | ORAL | 0 refills | Status: DC

## 2018-12-07 NOTE — Discharge Instructions (Signed)
Nhi?m tru?ng ????ng ti?u, Ng???i l??n Urinary Tract Infection, Adult  Nhi?m trng ???ng ti?u (Urinary Tract Infection, UTI) l m?t b?nh nhi?m trng ? b?t k? b? ph?n no c?a ???ng ti?u. ???ng ti?u bao g?m [place] qu? th?n, ni?u qu?n, bng quang v ni?u ??o. Nh??ng c? quan na?y ta?o ra, ch??a va? tha?i n???c ti?u trong c? th? ra ngoi. Chuyn gia ch?m [place] s?c kh?e c th? s? d?ng cc tn khc ?? m t? b?nh nhi?m trng. UTI trn ?nh h??ng ??n ni?u qu?n v th?n (vim b? th?n). UTI d??i h?n ?nh h??ng ??n bng quang (vim bng quang) v ni?u ??o (vim ni?u ??o). Nguyn nhn g gy ra? H?u h?t cc tr??ng h?p nhi?m trng ???ng ti?u l do vi khu?n ? vng sinh d?c c?a qu v?, quanh l?i vo ???ng ti?u (ni?u ??o) gy ra. Nh?ng vi khu?n ny pht tri?n v gy vim ???ng ti?u c?a qu v?. ?i?u g lm t?ng nguy c?? Qu v? d? b? tnh tr?ng ny h?n n?u:  Quy? vi? ?ang ???c ??t m?t ?ng thng ti?u (n??m trong).  Qu v? khng th? ki?m sot khi no qu v? ti?u ti?n ho?c ??i ti?n (qu v? b? khng t? ch?).  Qu v? l n? gi?i v qu v?: ? S? d?ng thu?c di?t tinh trng ho?c mng ch?n ?? trnh [place]. ? C n?ng ?? estrogen th?p. ? Co? [place].  Qu v? c m?t s? gen nh?t ??nh lm t?ng nguy c? (di truy?n).  Qu v? c quan h? tnh d?c.  Qu v? dng thu?c khng sinh.  Qu v? c m?t tnh tr?ng khi?n cho dng n??c ti?u ch?y ch?m l?i, ch?ng h?n nh?: ? Tuy?n ti?n li?t ph ??i, n?u qu v? l nam gi?i. ? T?c ngh?n trong ni?u ??o (cht h?p). ? S?i th?n. ? Tnh tr?ng ? dy th?n kinh ?nh h??ng ??n kh? n?ng ki?m sot bng quang (bng quang th?n kinh). ? Khng u?ng ?? n??c, ho?c khng ?i ti?u th??ng xuyn.  Qu v? c m?t s? tnh tr?ng b?nh l nh?t ??nh, ch?ng h?n nh?: ? Ti?u ???ng. ? Co? h? th?ng pho?ng ch?ng b?nh t?t (h? mi?n di?ch) y?u. ? B?nh h?ng c?u hnh li?m. ? B?nh gu?t. ? Ch?n th??ng t?y s?ng. Cc d?u hi?u ho?c tri?u ch?ng l g? Nh?ng tri?u ch?ng c?a tnh tr?ng ny bao g?m:  C?n ?i ti?u ngay l?p t?c (mt  ti?u).  Ti?u ti?n th???ng xuyn ho??c ?i l???ng nho? n???c ti?u th???ng xuyn.  ?au ho??c no?ng rt khi ti?u ti?n.  Mu trong n??c ti?u.  N???c ti?u co? mu?i hi ho??c b?t th???ng.  Kho? ?i ti?u.  N???c ti?u v?n ?u?c.  Kh h? ?? m ?a?o, n?u quy? vi? la? phu? n??.  ?au ? vng b?ng d??i ho??c th?t l?ng. Qu v? c?ng c th? ???c:  Nn ho?c gi?m c?m gic thm ?n.  L l?n.  Kch thch ho?c m?t m?i.  S?t.  Tiu ch?y. Tri?u ch?ng ??u tin ? ng??i cao tu?i c th? l l l?n. Trong m?t s? tr??ng h?p, h? c th? khng c b?t k? tri?u ch?ng no cho ??n khi nhi?m trng tr? nn tr?m tr?ng h?n. Ch?n ?on tnh tr?ng ny nh? th? no? Tnh tr?ng ny c th? ???c ch?n ?on d?a vo khai thc b?nh s? v khm th?c th?[place] v? c?ng c th? ph?i lm cc ki?m tra khc, bao g?m:  Xt nghi?m n??c ti?u.  Xt nghi?m mu.  Xt nghi?m cc b?nh nhi?m trng ly truy?n  qua ???ng tnh d?c (STI). N?u quy? vi? bi? nhi?u h?n m?t b?nh UTI th c th? c?n ph?i th?c hi?n vi?c n?i soi ba?ng quang ho??c ca?c nghin c?u hi?nh a?nh ?? xa?c ?i?nh nguyn nhn gy nhi?m tru?ng. Tnh tr?ng ny ???c ?i?u tr? nh? th? no? ?i?u tr? tnh tr?ng ny bao g?m:  Thu?c khng sinh.  Thu?c khng k ??n ?? lm gi?m c?m gic kh ch?u.  U?ng ?u? n???c ?? c? th? lun ?u? n???c. N?u qu v? b? nhi?m trng th??ng xuyn ho?c c cc tnh tr?ng khc nh? s?i th?n, qu v? c th? c?n ?i khm chuyn gia ch?m [place] s?c kh?e chuyn v? ???ng ti?t ni?u (bc s? chuyn khoa ti?t ni?u). Trong cc tr??ng h?p hi?m g?p, nhi?m trng ???ng ti?u c th? gy ra nhi?m trng huy?t. Nhi?m trng huy?t l tnh tr?ng ?e d?a tnh m?ng x?y ra khi c? th? ?p ?ng v?i m?t tnh tr?ng nhi?m trng. Nhi?m trng huy?t ???c ?i?u tr? trong b?nh vi?n b?ng thu?c khng sinh, d?ch truy?n v cc thu?c khc ???ng t?nh m?ch (IV). Tun th? nh?ng h??ng d?n ny ? nh:  Thu?c  Ch? s? d?ng thu?c khng k ??n v thu?c k ??n theo ch? d?n c?a chuyn gia ch?m [place] s?c  kh?e.  N?u qu v? ???c k thu?c khng sinh, hy dng thu?c theo ch? d?n c?a chuyn gia ch?m [place] s?c kh?e. Khng d?ng s? d?ng thu?c khng sinh ngay c? khi qu v? b?t ??u c?m th?y ?? h?n. H??ng d?n chung  ??m b?o qu v?: ? ?i ti?u th???ng xuyn va? h?t bi. Khng nhi?n ?i ti?u trong th??i gian da?i. ? ?i ti?u sau khi quan h? tnh d?c. ? Lau t?? tr???c ra sau sau khi ?i ?a?i ti?n n?u quy? vi? la? n?? gi?i. S?? du?ng m?i t? kh?n gi?y m?t l?n khi quy? vi? lau.  U?ng ?? n??c ?? gi? cho n??c ti?u c mu vng nh?t.  Tun th? t?t c? cc l?n khm theo di theo ch? d?n c?a chuyn gia ch?m [place] s?c kh?e. ?i?u ny c vai tr quan tr?ng. Hy lin l?c v?i chuyn gia ch?m [place] s?c kh?e n?u:  Cc tri?u ch?ng c?a qu v? khng ?? h?n sau 1-2 ngy.  Cc tri?u ch?ng h?t v sau ? ti pht. Yu c?u tr? gip ngay l?p t?c n?u qu v? c:  ?au r?t nhi?u ?? l?ng ho?c b?ng d??i c?a quy? vi?.  S?t.  Bu?n nn ho?c nn. Tm t?t  Nhi?m tru?ng ????ng ti?u (UTI) la? m?t nhi?m tru?ng ?? b?t c?? ph?n na?o cu?a ????ng ti?u, bao g?m th?n, ni?u qua?n, ba?ng quang va? ni?u ?a?o.  H?u h?t cc tr??ng h?p nhi?m trng ???ng ti?u l do vi khu?n ? vng sinh d?c c?a qu v?, quanh l?i vo ???ng ti?u (ni?u ??o) gy ra.  ?i?u tr? tnh tr?ng ny th??ng bao g?m dng cc thu?c khng sinh.  N?u qu v? ???c k thu?c khng sinh, hy dng thu?c theo ch? d?n c?a chuyn gia ch?m [place] s?c kh?e. Khng d?ng s? d?ng thu?c khng sinh ngay c? khi qu v? b?t ??u c?m th?y ?? h?n.  Tun th? t?t c? cc l?n khm theo di theo ch? d?n c?a chuyn gia ch?m [place] s?c kh?e. ?i?u ny c vai tr quan tr?ng. Thng tin ny khng nh?m m?c ?ch thay th? cho l?i khuyn m chuyn gia ch?m [place] s?c kh?e ni v?i qu v?. Hy b?o ??m qu v? ph?i th?o lu?n b?t k? v?n ??  g m qu v? c v?i chuyn gia ch?m [place] s?c kh?e c?a qu v?. Document Released: 11/01/2005 Document Revised: 07/03/2018 Document Reviewed: 07/03/2018 Elsevier Interactive Patient  Education  2019 Elsevier Inc. Phng ng?a sinh s?m Preventing Preterm Birth Sinh s?m l khi em b ???c sinh ra trong kho?ng t? 20 tu?n ??n 37 tu?n c?a [place] k?. M?t [place] k? ??y ?? ko di trong t nh?t 37 tu?n. Sinh s?m c th? nguy hi?m cho em b b?i v vi tu?n cu?i c?a [place] k? l th?i gian quan tr?ng cho no v ph?i c?a b t?ng tr??ng. Nhi?u nguyn nhn c th? khi?n em b b? ra ??i s?m. ?i khi, khng r nguyn nhn. C m?t s? y?u t? nh?t ??nh khi?n qu v? d? b? sinh s?m h?n, ch?ng h?n nh?:  C con tr??c c?ng sinh s?m.  Mang thai ?i ho??c ?a [place].  ? ???c ?i?u tr? v sinh.  B? th?a cn ho?c thi?u cn vo lc b?t ??u mang [place].  C b?t c?? tnh tr?ng na?o sau ?y trong su?t th?i k? mang thai: ? Nhi?m trng, bao g?m nhi?m trng ???ng ti?u (UTI) ho?c STI (b?nh ly truy?n qua ???ng tnh d?c). ? Huy?t p cao. ? Ti?u ???ng. ? Ch?y mu m ??o.  Tu?i t? 35 tr? ln.  Tu?i t? 18 tr? xu?ng.  Mang thai trong vng 6 thng k? t? l?n mang thai tr??c.  B? c?ng th?ng c?c ?? ho?c l?m d?ng th? xc ho?c tinh th?n trong [place] k?.  ??ng trong nh?ng kho?ng th?i gian di trong khi mang thai, ch?ng h?n nh? lm cng vi?c ?i h?i ph?i ??ng. Cc nguy c? l g? Nguy c? nghim tr?ng nh?t c?a sinh s?m l em b c th? khng s?ng ???c. ?i?u ny d? x?y ra h?n n?u em b ra ??i tr??c 34 tu?n. Cc nguy c? v bi?n ch?ng khc c?a sinh s?m c th? bao g?m em b b?:  Cc v?n ?? h h?p.  T?n th??ng no ?nh h??ng ??n c? ??ng v ph?i h?p (b?i no).  Kh cho ?n.  Cc v?n ?? v? th? l?c ho?c thnh l?c.  Nhi?m trng ho?c vim ???ng tiu ha (vim ??i trng).  Ch?m pht tri?n.  Khng c kh? n?ng h?c t?p.  Nguy c? cao h?n b? ti?u ???ng, b?nh tim v cao huy?t p sau ny trong ??i. Ti c th? lm g ?? gi?m nguy c?? Ch?m [place] s?c kh?e ?i?u quan tr?ng nh?t qu v? c th? lm ?? lm gi?m nguy c? sinh s?m l ???c ch?m [place] s?c kh?e ??nh k? trong su?t th?i gian mang thai (ch?m [place] tr??c khi sinh). N?u qu v? c nguy c? cao  b? sinh s?m, qu v? c th? ???c gi?i thi?u ??n m?t chuyn gia ch?m [place] s?c kh?e chuyn v? qu?n l cc ca mang thai nguy c? cao (bc s? [place] v? thai k? c nguy c? r?t cao). Qu v? c th? ???c cho dng thu?c ?? gip ng?n ng?a sinh s?m. Thay ??i l?i s?ng M?t s? thay ??i l?i s?ng c?ng c th? lm gi?m nguy c? sinh s?m:  Ch? t nh?t 6 thng sau m?t l?n mang thai ?? mang thai l?n n?a.  C? g?ng ln k? ho?ch mang thai khi qu v? t? 19 ??n 35 tu?i.  ??t ???c cn n?ng m?nh kh?e tr??c khi mang [place]. N?u qu v? b? th?a cn, hy lm vi?c v?i chuyn gia ch?m [place] s?c kh?e ?? gi?m cn  an ton.  Khng s? d?ng b?t k? s?n ph?m no ch?a nicotine ho?c thu?c l, ch?ng ha?n nh? thu?c l d?ng ht v thu?c l ?i?n t?. N?u qu v? c?n gip ?? ?? cai thu?c, hy h?i chuyn gia ch?m [place] s?c kh?e.  Khng u?ng r??u.  Khng s? d?ng ma ty. Tm s? h? tr? ? ?u ?? ???c h? tr? nhi?u h?n, hy cn nh?c:  Trao ??i v?i chuyn gia ch?m [place] s?c kh?e c?a mnh.  Trao ??i v?i bc s? tr? li?u ho?c chuyn gia t? v?n v? l?m d?ng ma ty, n?u qu v? c?n gip cai nghi?n.  Lm vi?c v?i m?t chuyn gia v? ch? ?? ?n v dinh d??ng (chuyn gia dinh d??ng) ho?c m?t hu?n luy?n vin c nhn ?? duy tr m?t cn n?ng kh?e m?nh.  Tham gia m?t nhm h? tr?. N?i ?? tm thm thng tin Tm hi?u thm v? cch phng trnh sinh s?m t?:  Trung tm Ki?m sot v Phng ng?a D?ch b?nh: http://cur[place].org/cdc.gov/reproductivehealth/maternalinfanthealth/pretermbirth.htm  March of Dimes: marchofdimes.org/complications/premature-babies.aspx  H?i Mang [place] K?: [email or web address]/labor-and-birth/premature-labor Hy lin l?c v?i chuyn gia ch?m [place] s?c kh?e n?u:  N?u qu v? c b?t k? d?u hi?u sinh s?m tr??c 37 tu?n no sau ?y: ? Thay ??i ho?c t?ng ti?t d?ch m ??o. ? Ch?y d?ch t? m ??o. ? p l?c ho?c co th?t ? b?ng d??i. ? ?au l?ng khng h?t ho?c tr?m tr?ng h?n. ? Th??ng xuyn b? g (co th?t) ? b?ng d??i. Tm t?t  Sinh s?m c ngh?a l em b sinh tra gi?a cc  tu?n 20-37 c?a thai k?.  Sinh s?m c th? khi?n em b c nguy c? c v?n ?? v? th? ch?t v tm th?n.  ???c ch?m [place] t?t tr??c khi sinh c th? gip ng?n ng?a sinh s?m.  Qu v? c th? gi?m nguy c? sinh s?m c?a mnh b?ng cch th?c hi?n m?t s? thay ??i nh?t ??nh v? l?i s?ng, ch?ng h?n nh? khng ht thu?c v khng s? d?ng r??u. Thng tin ny khng nh?m m?c ?ch thay th? cho l?i khuyn m chuyn gia ch?m [place] s?c kh?e ni v?i qu v?. Hy b?o ??m qu v? ph?i th?o lu?n b?t k? v?n ?? g m qu v? c v?i chuyn gia ch?m  s?c kh?e c?a qu v?. Document Released: 02/17/2017 Document Revised: 02/17/2017 Elsevier Interactive Patient Education  [employer]2019 Elsevier Inc.

## 2018-12-07 NOTE — MAU Note (Signed)
Pt here with pain on both sides and into her back that started around 1900. States the pain hurts more than her usual period. Denies vaginal bleeding or discharge. Has not tried anything for pain. Rates 3/10. Pt denies urinary s/s. Reports good fetal movement.

## 2018-12-07 NOTE — MAU Provider Note (Signed)
Chief Complaint:  Abdominal Pain  Provider saw patient at 2100   HPI: Shelley Conner is a 28 y.o. G2P1001 at 19w3dwho presents to maternity admissions reporting pain in lower pelvis radiating to back since 1900.  She reports good fetal movement, denies LOF, vaginal bleeding, vaginal itching/burning, urinary symptoms, h/a, dizziness, n/v, diarrhea, constipation or fever/chills.   Abdominal Pain  This is a new problem. The current episode started today. The onset quality is gradual. The problem occurs intermittently. The problem has been unchanged. The pain is located in the LLQ and RLQ. The quality of the pain is dull and cramping. The abdominal pain radiates to the back. Pertinent negatives include no constipation, diarrhea, dysuria or fever. Nothing aggravates the pain. The pain is relieved by nothing. She has tried nothing for the symptoms.    RN Note; Pt here with pain on both sides and into her back that started around 1900. States the pain hurts more than her usual period. Denies vaginal bleeding or discharge. Has not tried anything for pain. Rates 3/10. Pt denies urinary s/s. Reports good fetal movement.  Past Medical History: Past Medical History:  Diagnosis Date  . Medical history non-contributory     Past obstetric history: OB History  Gravida Para Term Preterm AB Living  2 1 1     1   SAB TAB Ectopic Multiple Live Births        0 1    # Outcome Date GA Lbr Len/2nd Weight Sex Delivery Anes PTL Lv  2 Current           1 Term 11/21/15 [redacted]w[redacted]d 07:20 / 03:10 3125 g M Vag-Spont EPI  LIV    Past Surgical History: Past Surgical History:  Procedure Laterality Date  . NO PAST SURGERIES    . WISDOM TOOTH EXTRACTION      Family History: History reviewed. No pertinent family history.  Social History: Social History   Tobacco Use  . Smoking status: Never Smoker  . Smokeless tobacco: Never Used  Substance Use Topics  . Alcohol use: No    Alcohol/week: 0.0 standard drinks  . Drug  use: No    Allergies: No Known Allergies  Meds:  Medications Prior to Admission  Medication Sig Dispense Refill Last Dose  . Prenatal Vit-Fe Fumarate-FA (PRENATAL MULTIVITAMIN) TABS tablet Take 1 tablet by mouth daily at 12 noon.   12/06/2018 at Unknown time  . AMOXICILLIN PO Take by mouth.   Taking  . ibuprofen (ADVIL,MOTRIN) 600 MG tablet Take 1 tablet (600 mg total) by mouth every 6 (six) hours as needed for mild pain, moderate pain or cramping. (Patient not taking: Reported on 09/21/2018) 30 tablet 0 Not Taking    I have reviewed patient's Past Medical Hx, Surgical Hx, Family Hx, Social Hx, medications and allergies.   ROS:  Review of Systems  Constitutional: Negative for fever.  Gastrointestinal: Positive for abdominal pain. Negative for constipation and diarrhea.  Genitourinary: Negative for dysuria.   Other systems negative  Physical Exam   Patient Vitals for the past 24 hrs:  BP Temp Pulse Resp SpO2 Height Weight  12/07/18 2045 111/70 98.2 F (36.8 C) (!) 104 16 99 % 5' (1.524 m) 63.5 kg   Constitutional: Well-developed, well-nourished female in no acute distress.  Cardiovascular: normal rate and rhythm Respiratory: normal effort, clear to auscultation bilaterally GI: Abd soft, non-tender, gravid appropriate for gestational age.   No rebound or guarding. MS: Extremities nontender, no edema, normal ROM Neurologic: Alert and oriented  x 4.  GU: Neg CVAT.  PELVIC EXAM: Dilation: Closed Effacement (%): 50 Cervical Position: Posterior Station: Ballotable Exam by:: [employer]   FHT:  Baseline 140 , moderate variability, accelerations present, no decelerations Contractions: q 2-4 mins Irregular, mild   Labs:    Results for orders placed or performed during the hospital encounter of 12/07/18 (from the past 24 hour(s))  Urinalysis, Routine w reflex microscopic     Status: Abnormal   Collection Time: 12/07/18  8:53 PM  Result Value Ref Range   Color, Urine STRAW (A)  YELLOW   APPearance HAZY (A) CLEAR   Specific Gravity, Urine 1.001 (L) 1.005 - 1.030   pH 6.0 5.0 - 8.0   Glucose, UA NEGATIVE NEGATIVE mg/dL   Hgb urine dipstick SMALL (A) NEGATIVE   Bilirubin Urine NEGATIVE NEGATIVE   Ketones, ur NEGATIVE NEGATIVE mg/dL   Protein, ur NEGATIVE NEGATIVE mg/dL   Nitrite NEGATIVE NEGATIVE   Leukocytes, UA LARGE (A) NEGATIVE   RBC / HPF 0-5 0 - 5 RBC/hpf   WBC, UA 0-5 0 - 5 WBC/hpf   Bacteria, UA NONE SEEN NONE SEEN   Squamous Epithelial / LPF 0-5 0 - 5  Fetal fibronectin     Status: None   Collection Time: 12/07/18  9:24 PM  Result Value Ref Range   Fetal Fibronectin NEGATIVE NEGATIVE     Imaging:  No results found.  MAU Course/MDM: I have ordered labs and reviewed results. Urine showed leukocytes, possible infection?  Sent for culture Fetal fibronectin collected prior to exam due to frequent contractions and resulted as Negative NST reviewed, reactive  Procardia ordered for tocolysis  Two doses were required and then contractions stopped.  Fetal fibronectin then resulted as negative Discussed findings with patient and husband.  Possible etiology of UTI discussed.  Discussed that we are reassured that contractions resolved and FFn is negative.   Assessment: Single intrauterine pregnancy at [redacted]w[redacted]d Preterm uterine contractions in second trimester, antepartum - Plan: Discharge patient  Leukocytes in urine  Plan: Discharge home Preterm Labor precautions and fetal kick counts Urine to culture Will treat with Keflex for presumptive UTI Follow up in Office for prenatal visits and recheck of cervix  Encouraged to return here or to other Urgent Care/ED if she develops worsening of symptoms, increase in pain, fever, or other concerning symptoms.   Pt stable at time of discharge.  Wynelle Bourgeois CNM, MSN Certified Nurse-Midwife 12/07/2018 9:29 PM

## 2018-12-09 LAB — CULTURE, OB URINE: CULTURE: NO GROWTH

## 2019-03-09 ENCOUNTER — Encounter (HOSPITAL_COMMUNITY): Payer: Self-pay | Admitting: *Deleted

## 2019-03-09 ENCOUNTER — Other Ambulatory Visit: Payer: Self-pay

## 2019-03-09 ENCOUNTER — Inpatient Hospital Stay (HOSPITAL_COMMUNITY)
Admission: AD | Admit: 2019-03-09 | Discharge: 2019-03-09 | Disposition: A | Discharge status: Home / Self Care | Payer: Medicaid Other | Attending: Obstetrics & Gynecology | Admitting: Obstetrics & Gynecology

## 2019-03-09 DIAGNOSIS — O479 False labor, unspecified: Secondary | ICD-10-CM | POA: Diagnosis not present

## 2019-03-09 DIAGNOSIS — M545 Low back pain: Secondary | ICD-10-CM | POA: Insufficient documentation

## 2019-03-09 DIAGNOSIS — R51 Headache: Secondary | ICD-10-CM

## 2019-03-09 DIAGNOSIS — Z3A37 37 weeks gestation of pregnancy: Secondary | ICD-10-CM | POA: Insufficient documentation

## 2019-03-09 DIAGNOSIS — O26893 Other specified pregnancy related conditions, third trimester: Secondary | ICD-10-CM | POA: Diagnosis not present

## 2019-03-09 DIAGNOSIS — O471 False labor at or after 37 completed weeks of gestation: Secondary | ICD-10-CM | POA: Insufficient documentation

## 2019-03-09 DIAGNOSIS — Z3689 Encounter for other specified antenatal screening: Secondary | ICD-10-CM | POA: Insufficient documentation

## 2019-03-09 LAB — URINALYSIS, ROUTINE W REFLEX MICROSCOPIC
Bacteria, UA: NONE SEEN
Bilirubin Urine: NEGATIVE
Glucose, UA: NEGATIVE mg/dL
Hgb urine dipstick: NEGATIVE
Ketones, ur: NEGATIVE mg/dL
Nitrite: NEGATIVE
Protein, ur: NEGATIVE mg/dL
Specific Gravity, Urine: 1.001 — ABNORMAL LOW (ref 1.005–1.030)
pH: 7 (ref 5.0–8.0)

## 2019-03-09 MED ORDER — ACETAMINOPHEN 500 MG PO TABS
1000.0000 mg | ORAL_TABLET | Freq: Once | ORAL | Status: AC
  Administered 2019-03-09: 1000 mg via ORAL
  Filled 2019-03-09: qty 2

## 2019-03-09 MED ORDER — CYCLOBENZAPRINE HCL 10 MG PO TABS
10.0000 mg | ORAL_TABLET | Freq: Once | ORAL | Status: AC
  Administered 2019-03-09: 10 mg via ORAL
  Filled 2019-03-09: qty 1

## 2019-03-09 NOTE — MAU Provider Note (Signed)
First Provider Initiated Contact with Patient 03/09/19 2133     S: Ms. Shelley Conner is a 28 y.o. G2P1001 at [redacted]w[redacted]d  who presents to MAU today complaining contractions q 10 minutes since 1600. She denies vaginal bleeding. She denies LOF. She reports normal fetal movement. She reports intermittent lower back pain, rates pain 3/10- has not taken any medication for back pain. Reports HA started when contractions and back pain started as well, rates pain 4/10- has not taken any medication for HA.    O:  Patient Vitals for the past 24 hrs:  BP Temp Pulse Resp SpO2 Height Weight  03/09/19 2255 104/78 - 92 16 - - -  03/09/19 2223 - - 98 - 97 % - -  03/09/19 2104 - - (!) 115 - - - -  03/09/19 2049 126/72 - (!) 120 - 98 % - -  03/09/19 2047 - 98.7 F (37.1 C) - 18 - 5' (1.524 m) 70.3 kg   GENERAL: Well-developed, well-nourished female in no acute distress.  HEAD: Normocephalic, atraumatic.  CHEST: Normal effort of breathing, regular heart rate ABDOMEN: Soft, nontender, gravid  Cervical exam:  Dilation: Closed(outer os 1cm and inner os closed) Effacement (%): 50 Cervical Position: Middle Station: -3 Presentation: Undeterminable Exam by:: Quintella Baton RNC   Fetal Monitoring: Baseline: 135 Variability: moderate  Accelerations: present  Decelerations: none  Contractions: occasional mild contractions  Treatments in MAU included 1000mg  Tylenol and 10mg  Flexeril for HA and back pain. Reassessment patient reports HA and back pain are resolved. Occasional mild contractions on cervical examination. Pt stable prior to discharge home  A: SIUP at [redacted]w[redacted]d  False labor  NST reactive  Intractable HA   P: Discharge home Follow up as scheduled for prenatal appointments  Return to MAU as needed  Labor precautions   Sharyon Cable, CNM 03/09/2019 11:46 PM

## 2019-03-09 NOTE — Progress Notes (Signed)
Baby moving

## 2019-03-09 NOTE — Progress Notes (Signed)
Written and verbal d/c instructions given and understanding voiced. Pt stated she did not need interpreter for d/c papers. Has appt next wk. 0 pain currently

## 2019-03-09 NOTE — MAU Note (Signed)
Having back and abdominal pain since 1830. Headache for an hour. Denies LOF or bleeding. No meds for h/a.

## 2019-03-09 NOTE — Progress Notes (Signed)
Steward Drone CNM

## 2019-03-09 NOTE — Discharge Instructions (Signed)
C?n co th?t Braxton Hicks Braxton Hicks Contractions Cc c?n co th?t t? cung c th? x?y ra trong su?t [place] k?, nh?ng khng ph?i lc no c?ng l d?u hi?u cho th?y qu v? ?ang chuy?n d?. Qu v? c th? c cc c?n co th?t th?c t?p ???c g?i l c?n co th?t [employer]. Nh?ng c?n co th?t chuy?n d? gi? ny ?i khi b? nh?m v?i chuy?n d? th?t. C?n co th?t Braxton Hicks l g? C?n co th?t Braxton Hicks l nh?ng chuy?n ??ng co th?t x?y ra trong c? t? cung tr??c khi chuy?n d?. Khng gi?ng cc c?n co th?t chuy?n d? th?t, nh?ng c?n co th?t ny khng lm m? (gin n?) v lm m?ng c? t? cung. V? cu?i [place] k? (32-34 tu?n), cc c?n co th?t Braxton Hicks c th? x?y ra th??ng xuyn h?n v c th? tr? ln m?nh h?n. ?i khi, nh?ng c?n co th?t ny kh phn bi?t v?i chuy?n d? th?t b?i v n c th? r?t kh ch?u. Qu v? ??ng c?m th?y ng??ng ngng n?u qu v? vo b?nh vi?n khi c chuy?n d? gi?. ?i khi, cch duy nh?t ?? bi?t qu v? c chuy?n d? th?t hay khng l ph?i ?? chuyn gia ch?m [place] s?c kh?e tm ki?m nh?ng thay ??i ? c? t? cung. Chuyn gia ch?m [place] s?c kh?e s? khm th?c th? v c th? theo di cc c?n co th?t c?a qu v?. N?u qu v? khng ph?i ?ang chuy?n d? th?t, vi?c th?m khm s? cho th?y c? t? cung c?a qu v? ?ang khng gin n? v ?i ch?a v?. N?u khng c v?n ?? s?c kh?e no khc lin quan ??n [place] k?, ?? qu v? ra v? trong tr??ng h?p chuy?n d? gi? l hon ton an ton [employer] v?. Qu v? c th? ti?p t?c c cc c?n co th?t Braxton Hicks cho ??n khi qu v? chuy?n d? th?t. Cch phn bi?t gi?a chuy?n d? th?t v chuy?n d? gi? Chuy?n d? th?t  Cc c?n co th?t ko di 30-70 giy.  Cc c?n co th?t tr? ln r?t ??u ??n.  C?m gic kh ch?u th??ng c?m th?y ? ??nh t? cung v lan t?i vng b?ng d??i v vng th?t l?ng.  Cc c?n co th?t khng h?t khi qu v? ?i l?i.  Cc c?n co th?t th??ng tr? ln m?nh h?n v t?ng t?n su?t.  C? t? cung gin n? v m?ng h?n. Chuy?n d? gi?  Cc c?n co th?t th??ng ng?n h?n v khng m?nh nh? c?n co th?t  chuy?n d? th?t.  Cc c?n co th?t th??ng khng ??u.  Cc c?n co th?t th??ng ???c c?m nh?n ? ph?n tr??c b?ng d??i v ? b?n.  Cc c?n co th?t c th? h?t khi qu v? ?i l?i ho?c thay ??i t? th? khi n?m.  Cc c?n co th?t tr? ln y?u h?n v ng?n h?n theo th?i gian.  C? t? cung th??ng khng gin ra ho?c m?ng ?i. Tun th? nh?ng h??ng d?n ny ? nh:   Ch? s? d?ng thu?c khng k ??n v thu?c k ??n theo ch? d?n c?a chuyn gia ch?m [place] s?c kh?e.  Ti?p t?c cc bi th? d?c thng th??ng c?a qu v? v lm theo cc ch? d?n khc c?a chuyn gia ch?m [place] s?c kh?e.  ?n v u?ng ?? nh? n?u qu v? ngh? qu v? s?p chuy?n d?.  N?u cc c?n co th?t Braxton Hicks lm qu v? kh ch?u: ? Thay ??  nh? n?u qu v? ngh? qu v? s?p chuy?n d?.   N?u cc c?n co th?t Braxton Hicks lm qu v? kh ch?u:  ? Thay ??i t? t? th? n?m ho?c ngh? ng?i sang ?i l?i, ho?c t? ?i l?i sang ngh? ng?i.  ? Ng?i v ngh? ng?i trong m?t b?n n??c ?m.  ? U?ng ?? n??c ?? gi? cho n??c ti?u c mu vng nh?t. M?t n??c c th? gy ra nh?ng c?n co th?t nh? v?y.  ? Th? ch?m v su vi l?n trong m?t ti?ng.   Tun th? t?t c? cc l?n khm theo di tr??c khi sinh theo ch? d?n c?a chuyn gia ch?m sc s?c kh?e. ?i?u ny c vai tr quan tr?ng.  Hy lin l?c v?i chuyn gia ch?m sc s?c kh?e n?u:   Qu v? b? s?t.   Qu v? ti?p t?c b? ?au ? b?ng.  Yu c?u tr? gip ngay l?p t?c n?u:   Cc c?n co th?t tr? nn m?nh h?n, ??u h?n v g?n nhau h?n.   Qu v? b? r? d?ch ho?c ti?t d?ch ? m ??o.   Qu v? ra d?ch nh?y c l?n mu (nh?t mu).   Qu v? b? ch?y mu ? m ??o.   Qu v? b? ?au vng th?t l?ng m tr??c ?y ch?a t?ng b? bao gi?.   Qu v? c?m th?y ??u c?a con qu v? ??y xu?ng d??i v gy c?m gic ? n?ng ln vng khung ch?u.   Con qu v? khng c? ??ng bn trong qu v? nhi?u nh? tr??c.  Tm t?t   C?n co th?t x?y ra tr??c khi chuy?n d? ???c g?i l c?n co th?t Braxton Hicks, chuy?n d? gi?, ho?c cc c?n co th?t th?c t?p.   Cc c?n co th?t Braxton Hicks th??ng ng?n h?n, y?u h?n, cch xa h?n v t th??ng xuyn h?n cc c?n co th?t chuy?n d? th?t. Cc c?n co th?t chuy?n d? th?t th??ng  tr? ln m?nh d?n ln v ??u ??n, ??ng th?i tr? ln th??ng xuyn h?n.   X? tr c?m gic kh ch?u do cc c?n co th?t Braxton Hicks gy ra b?ng cch thay ??i t? th?, ngh? ng?i trong b?n t?m n??c ?m, u?ng nhi?u n??c, ho?c t?p th? su.  Thng tin ny khng nh?m m?c ?ch thay th? cho l?i khuyn m chuyn gia ch?m sc s?c kh?e ni v?i qu v?. Hy b?o ??m qu v? ph?i th?o lu?n b?t k? v?n ?? g m qu v? c v?i chuyn gia ch?m sc s?c kh?e c?a qu v?.  Document Released: 04/21/2017 Document Revised: 10/17/2017 Document Reviewed: 04/21/2017  Elsevier Interactive Patient Education  2019 [employer]vier Inc[employer]

## 2019-03-15 ENCOUNTER — Other Ambulatory Visit: Payer: Self-pay

## 2019-03-15 ENCOUNTER — Inpatient Hospital Stay (HOSPITAL_COMMUNITY): Payer: Medicaid Other | Admitting: Anesthesiology

## 2019-03-15 ENCOUNTER — Inpatient Hospital Stay (HOSPITAL_COMMUNITY)
Admission: AD | Admit: 2019-03-15 | Discharge: 2019-03-16 | DRG: 798 | Disposition: A | Discharge status: Home / Self Care | Payer: Medicaid Other | Attending: Obstetrics & Gynecology | Admitting: Obstetrics & Gynecology

## 2019-03-15 ENCOUNTER — Encounter (HOSPITAL_COMMUNITY): Payer: Self-pay

## 2019-03-15 DIAGNOSIS — Z9189 Other specified personal risk factors, not elsewhere classified: Secondary | ICD-10-CM

## 2019-03-15 DIAGNOSIS — Z3A38 38 weeks gestation of pregnancy: Secondary | ICD-10-CM

## 2019-03-15 DIAGNOSIS — O26893 Other specified pregnancy related conditions, third trimester: Secondary | ICD-10-CM | POA: Diagnosis present

## 2019-03-15 DIAGNOSIS — O99824 Streptococcus B carrier state complicating childbirth: Secondary | ICD-10-CM | POA: Diagnosis present

## 2019-03-15 LAB — CBC
HCT: 41.1 % (ref 36.0–46.0)
Hemoglobin: 13.4 g/dL (ref 12.0–15.0)
MCH: 29.6 pg (ref 26.0–34.0)
MCHC: 32.6 g/dL (ref 30.0–36.0)
MCV: 90.7 fL (ref 80.0–100.0)
Platelets: 269 10*3/uL (ref 150–400)
RBC: 4.53 MIL/uL (ref 3.87–5.11)
RDW: 13.2 % (ref 11.5–15.5)
WBC: 15.5 10*3/uL — ABNORMAL HIGH (ref 4.0–10.5)
nRBC: 0 % (ref 0.0–0.2)

## 2019-03-15 LAB — TYPE AND SCREEN
ABO/RH(D): O POS
Antibody Screen: NEGATIVE

## 2019-03-15 LAB — ABO/RH: ABO/RH(D): O POS

## 2019-03-15 LAB — RPR: RPR Ser Ql: NONREACTIVE

## 2019-03-15 MED ORDER — LACTATED RINGERS IV SOLN
INTRAVENOUS | Status: DC
  Administered 2019-03-15: 01:00:00 via INTRAVENOUS

## 2019-03-15 MED ORDER — BENZOCAINE-MENTHOL 20-0.5 % EX AERO: 1.0000 "application " | INHALATION_SPRAY | CUTANEOUS | Status: DC | PRN

## 2019-03-15 MED ORDER — TERBUTALINE SULFATE 1 MG/ML IJ SOLN
INTRAMUSCULAR | Status: AC
  Administered 2019-03-15: 08:00:00 0.25 mg
  Filled 2019-03-15: qty 1

## 2019-03-15 MED ORDER — WITCH HAZEL-GLYCERIN EX PADS: 1.0000 "application " | MEDICATED_PAD | CUTANEOUS | Status: DC | PRN

## 2019-03-15 MED ORDER — DIPHENHYDRAMINE HCL 50 MG/ML IJ SOLN: 12.5000 mg | INTRAMUSCULAR | Status: DC | PRN

## 2019-03-15 MED ORDER — ACETAMINOPHEN 325 MG PO TABS: 650.0000 mg | ORAL_TABLET | ORAL | Status: DC | PRN

## 2019-03-15 MED ORDER — FENTANYL-BUPIVACAINE-NACL 0.5-0.125-0.9 MG/250ML-% EP SOLN: 12.0000 mL/h | EPIDURAL | Status: DC | PRN

## 2019-03-15 MED ORDER — SODIUM CHLORIDE 0.9 % IV SOLN
5.0000 10*6.[IU] | Freq: Once | INTRAVENOUS | Status: AC
  Administered 2019-03-15: 02:00:00 5 10*6.[IU] via INTRAVENOUS
  Filled 2019-03-15: qty 5

## 2019-03-15 MED ORDER — SIMETHICONE 80 MG PO CHEW: 80.0000 mg | CHEWABLE_TABLET | ORAL | Status: DC | PRN

## 2019-03-15 MED ORDER — SOD CITRATE-CITRIC ACID 500-334 MG/5ML PO SOLN: 30.0000 mL | ORAL | Status: DC | PRN

## 2019-03-15 MED ORDER — OXYTOCIN 40 UNITS IN NORMAL SALINE INFUSION - SIMPLE MED
2.5000 [IU]/h | INTRAVENOUS | Status: DC
  Filled 2019-03-15: qty 1000

## 2019-03-15 MED ORDER — METHYLERGONOVINE MALEATE 0.2 MG PO TABS
0.2000 mg | ORAL_TABLET | Freq: Three times a day (TID) | ORAL | Status: DC
  Administered 2019-03-15 – 2019-03-16 (×3): 0.2 mg via ORAL
  Filled 2019-03-15 (×4): qty 1

## 2019-03-15 MED ORDER — LACTATED RINGERS IV SOLN
500.0000 mL | INTRAVENOUS | Status: DC | PRN
  Administered 2019-03-15: 500 mL via INTRAVENOUS

## 2019-03-15 MED ORDER — ONDANSETRON HCL 4 MG/2ML IJ SOLN
4.0000 mg | Freq: Four times a day (QID) | INTRAMUSCULAR | Status: DC | PRN
  Administered 2019-03-15: 4 mg via INTRAVENOUS
  Filled 2019-03-15: qty 2

## 2019-03-15 MED ORDER — SENNOSIDES-DOCUSATE SODIUM 8.6-50 MG PO TABS
2.0000 | ORAL_TABLET | ORAL | Status: DC
  Administered 2019-03-15: 2 via ORAL
  Filled 2019-03-15: qty 2

## 2019-03-15 MED ORDER — LACTATED RINGERS IV SOLN
500.0000 mL | Freq: Once | INTRAVENOUS | Status: AC
  Administered 2019-03-15: 07:00:00 500 mL via INTRAVENOUS

## 2019-03-15 MED ORDER — FENTANYL CITRATE (PF) 100 MCG/2ML IJ SOLN
100.0000 ug | INTRAMUSCULAR | Status: DC | PRN
  Administered 2019-03-15: 100 ug via INTRAVENOUS
  Filled 2019-03-15: qty 2

## 2019-03-15 MED ORDER — FENTANYL-BUPIVACAINE-NACL 0.5-0.125-0.9 MG/250ML-% EP SOLN
12.0000 mL/h | EPIDURAL | Status: DC | PRN
  Filled 2019-03-15: qty 250

## 2019-03-15 MED ORDER — SODIUM CHLORIDE (PF) 0.9 % IJ SOLN
INTRAMUSCULAR | Status: DC | PRN
  Administered 2019-03-15: 14 mL/h via EPIDURAL

## 2019-03-15 MED ORDER — COCONUT OIL OIL: 1.0000 "application " | TOPICAL_OIL | Status: DC | PRN

## 2019-03-15 MED ORDER — EPHEDRINE 5 MG/ML INJ: 10.0000 mg | INTRAVENOUS | Status: DC | PRN

## 2019-03-15 MED ORDER — PHENYLEPHRINE 40 MCG/ML (10ML) SYRINGE FOR IV PUSH (FOR BLOOD PRESSURE SUPPORT)
80.0000 ug | PREFILLED_SYRINGE | INTRAVENOUS | Status: DC | PRN
  Administered 2019-03-15: 08:00:00 80 ug via INTRAVENOUS
  Filled 2019-03-15: qty 10

## 2019-03-15 MED ORDER — LIDOCAINE HCL (PF) 1 % IJ SOLN
INTRAMUSCULAR | Status: DC | PRN
  Administered 2019-03-15: 6 mL via EPIDURAL

## 2019-03-15 MED ORDER — ONDANSETRON HCL 4 MG PO TABS: 4.0000 mg | ORAL_TABLET | ORAL | Status: DC | PRN

## 2019-03-15 MED ORDER — PENICILLIN G 3 MILLION UNITS IVPB - SIMPLE MED
3.0000 10*6.[IU] | INTRAVENOUS | Status: DC
  Administered 2019-03-15 (×2): 3 10*6.[IU] via INTRAVENOUS
  Filled 2019-03-15 (×11): qty 100

## 2019-03-15 MED ORDER — PRENATAL MULTIVITAMIN CH
1.0000 | ORAL_TABLET | Freq: Every day | ORAL | Status: DC
  Administered 2019-03-16: 12:00:00 1 via ORAL
  Filled 2019-03-15: qty 1

## 2019-03-15 MED ORDER — OXYTOCIN BOLUS FROM INFUSION
500.0000 mL | Freq: Once | INTRAVENOUS | Status: AC
  Administered 2019-03-15: 500 mL via INTRAVENOUS

## 2019-03-15 MED ORDER — ONDANSETRON HCL 4 MG/2ML IJ SOLN: 4.0000 mg | INTRAMUSCULAR | Status: DC | PRN

## 2019-03-15 MED ORDER — DIBUCAINE (PERIANAL) 1 % EX OINT: 1.0000 "application " | TOPICAL_OINTMENT | CUTANEOUS | Status: DC | PRN

## 2019-03-15 MED ORDER — IBUPROFEN 600 MG PO TABS
600.0000 mg | ORAL_TABLET | Freq: Four times a day (QID) | ORAL | Status: DC
  Administered 2019-03-15 – 2019-03-16 (×3): 600 mg via ORAL
  Filled 2019-03-15 (×4): qty 1

## 2019-03-15 MED ORDER — LIDOCAINE HCL (PF) 1 % IJ SOLN: 30.0000 mL | INTRAMUSCULAR | Status: DC | PRN

## 2019-03-15 MED ORDER — PIPERACILLIN-TAZOBACTAM 3.375 G IVPB
3.3750 g | Freq: Three times a day (TID) | INTRAVENOUS | Status: AC
  Administered 2019-03-15 – 2019-03-16 (×3): 3.375 g via INTRAVENOUS
  Filled 2019-03-15 (×5): qty 50

## 2019-03-15 MED ORDER — PHENYLEPHRINE 40 MCG/ML (10ML) SYRINGE FOR IV PUSH (FOR BLOOD PRESSURE SUPPORT)
80.0000 ug | PREFILLED_SYRINGE | INTRAVENOUS | Status: DC | PRN
  Administered 2019-03-15 (×2): 80 ug via INTRAVENOUS

## 2019-03-15 MED ORDER — ZOLPIDEM TARTRATE 5 MG PO TABS: 5.0000 mg | ORAL_TABLET | Freq: Every evening | ORAL | Status: DC | PRN

## 2019-03-15 MED ORDER — DIPHENHYDRAMINE HCL 25 MG PO CAPS: 25.0000 mg | ORAL_CAPSULE | Freq: Four times a day (QID) | ORAL | Status: DC | PRN

## 2019-03-15 MED ORDER — TETANUS-DIPHTH-ACELL PERTUSSIS 5-2.5-18.5 LF-MCG/0.5 IM SUSP: 0.5000 mL | Freq: Once | INTRAMUSCULAR | Status: DC

## 2019-03-15 NOTE — MAU Note (Signed)
Pt states that started having ctx's at 2200.   Reports +FM    Reports some vaginal bleeding(none noted on glove when checking cervix)

## 2019-03-15 NOTE — Anesthesia Postprocedure Evaluation (Signed)
Anesthesia Post Note  Patient: Shelley Conner  Procedure(s) Performed: AN AD HOC LABOR EPIDURAL     Patient location during evaluation: Mother Baby Anesthesia Type: Epidural Level of consciousness: awake and alert Pain management: pain level controlled Vital Signs Assessment: post-procedure vital signs reviewed and stable Respiratory status: spontaneous breathing, nonlabored ventilation and respiratory function stable Cardiovascular status: stable Postop Assessment: no headache, no backache and epidural receding Anesthetic complications: no    Last Vitals:  Vitals:   03/15/19 1416 03/15/19 1432  BP: (!) 91/58 102/60  Pulse: (!) 101 99  Resp: 18   Temp:    SpO2:      Last Pain:  Vitals:   03/15/19 1302  TempSrc:   PainSc: 0-No pain   Pain Goal:                   Shelley Conner

## 2019-03-15 NOTE — Progress Notes (Signed)
LABOR PROGRESS NOTE  Shelley Conner is a 28 y.o. G2P1001 at [redacted]w[redacted]d admitted for SOL  Subjective: Feeling pain with contractions No pressure yet Declines any medication for pain   Objective: BP 109/82   Pulse 82   Temp 97.8 F (36.6 C) (Oral)   Resp 18   Ht 5' (1.524 m)   Wt 70.3 kg   LMP 06/19/2018   SpO2 99%   BMI 30.27 kg/m  or  Vitals:   03/15/19 0023 03/15/19 0032 03/15/19 0133 03/15/19 0530  BP:  112/76 125/72 109/82  Pulse:  (!) 109 (!) 101 82  Resp:  16 18 18   Temp:  97.9 F (36.6 C) 97.8 F (36.6 C)   TempSrc:   Oral   SpO2:  99%    Weight: 70.3 kg  70.3 kg   Height:   5' (1.524 m)     Dilation: 4.5 Effacement (%): 80 Station: -2 Presentation: Vertex Exam by:: Gwenevere Abbot, MD  FHT: baseline rate 130s, moderate varibility, + acel, no decel Toco: q2-68m (difficult to trace)  Labs: Lab Results  Component Value Date   WBC 15.5 (H) 03/15/2019   HGB 13.4 03/15/2019   HCT 41.1 03/15/2019   MCV 90.7 03/15/2019   PLT 269 03/15/2019    Patient Active Problem List   Diagnosis Date Noted  . Normal labor 03/15/2019  . Active labor 11/21/2015  . Encounter for (NT) nuchal translucency scan   . [redacted] weeks gestation of pregnancy     Assessment / Plan: 28 y.o. G2P1001 at [redacted]w[redacted]d here for SOL  Labor: AROM with moderate mec.  Fetal Wellbeing:  Cat 1 Pain Control:  Maternal support Anticipated MOD:  SVD  Gwenevere Abbot, MD OB Fellow  03/15/2019, 5:36 AM

## 2019-03-15 NOTE — Progress Notes (Signed)
Faculty Practice OB/GYN Attending Note  Patient had prolonged 6-7 min FHR deceleration after epidural. Received phenylephrine x 2 doses and one dose of terbutaline, FHR became reactive again. Will continue to monitor closely.  Hopeful for vaginal delivery.   Jaynie Collins, MD, FACOG Obstetrician & Gynecologist, [facility] for [employer], [facility]

## 2019-03-15 NOTE — Anesthesia Procedure Notes (Addendum)
Epidural Patient location during procedure: OB Start time: 03/15/2019 7:47 AM End time: 03/15/2019 7:55 AM  Staffing Anesthesiologist: Bethena Midget, MD  Preanesthetic Checklist Completed: patient identified, site marked, surgical consent, pre-op evaluation, timeout performed, IV checked, risks and benefits discussed and monitors and equipment checked  Epidural Patient position: sitting Prep: site prepped and draped and DuraPrep Patient monitoring: continuous pulse ox and blood pressure Approach: midline Location: L3-L4 Injection technique: LOR air  Needle:  Needle type: Tuohy  Needle gauge: 17 G Needle length: 9 cm and 9 Needle insertion depth: 5 cm cm Catheter type: closed end flexible Catheter size: 19 Gauge Catheter at skin depth: 10 cm Test dose: negative  Assessment Events: blood not aspirated, injection not painful, no injection resistance, negative IV test and no paresthesia

## 2019-03-15 NOTE — Anesthesia Preprocedure Evaluation (Signed)
Anesthesia Evaluation  Patient identified by MRN, date of birth, ID band Patient awake    Reviewed: Allergy & Precautions, H&P , NPO status , Patient's Chart, lab work & pertinent test results, reviewed documented beta blocker date and time   Airway Mallampati: I  TM Distance: >3 FB Neck ROM: full    Dental no notable dental hx. (+) Teeth Intact   Pulmonary neg pulmonary ROS,    Pulmonary exam normal breath sounds clear to auscultation       Cardiovascular negative cardio ROS Normal cardiovascular exam Rhythm:regular Rate:Normal     Neuro/Psych negative neurological ROS  negative psych ROS   GI/Hepatic negative GI ROS, Neg liver ROS,   Endo/Other  negative endocrine ROS  Renal/GU negative Renal ROS  negative genitourinary   Musculoskeletal   Abdominal   Peds  Hematology negative hematology ROS (+)   Anesthesia Other Findings   Reproductive/Obstetrics (+) Pregnancy                             Anesthesia Physical Anesthesia Plan  ASA: II  Anesthesia Plan: Epidural   Post-op Pain Management:    Induction:   PONV Risk Score and Plan:   Airway Management Planned:   Additional Equipment:   Intra-op Plan:   Post-operative Plan:   Informed Consent: I have reviewed the patients History and Physical, chart, labs and discussed the procedure including the risks, benefits and alternatives for the proposed anesthesia with the patient or authorized representative who has indicated his/her understanding and acceptance.     Dental Advisory Given  Plan Discussed with: CRNA, Anesthesiologist and Surgeon  Anesthesia Plan Comments: (Labs checked- platelets confirmed with RN in room. Fetal heart tracing, per RN, reported to be stable enough for sitting procedure. Discussed epidural, and patient consents to the procedure:  included risk of possible headache,backache, failed block, allergic  reaction, and nerve injury. This patient was asked if she had any questions or concerns before the procedure started.)        Anesthesia Quick Evaluation

## 2019-03-15 NOTE — Lactation Note (Signed)
This note was copied from a baby's chart. Lactation Consultation Note  Patient Name: Shelley Conner Today's Date: 03/15/2019    [name] is 4 hours old but mom already switch to formula. Spoke to [employer] and she confirmed that to [name], mom came as BF but baby is now being 100% formula fed, LC services no longer needed.  Baby is 4 hours old Maternal Data    Feeding    Interventions    Lactation Tools Discussed/Used     Consult Status      [name] [name] 03/15/2019, 5:51 PM

## 2019-03-15 NOTE — Discharge Summary (Signed)
Postpartum Discharge Summary     Patient Name: Shelley Conner DOB: Dec 09, 1990 MRN: [id number]  Date of admission: 03/15/2019 Delivering Provider: Sharyon Cable   Date of discharge: 03/16/2019  Admitting diagnosis: 38.2WKS CTX Intrauterine pregnancy: [redacted]w[redacted]d     Secondary diagnosis:  Active Problems:   Normal labor   Retained placenta without hemorrhage, delivered, current hospitalization   SVD (spontaneous vaginal delivery)  Additional problems: none     Discharge diagnosis: Term Pregnancy Delivered                                                                                                Post partum procedures:curettage   Augmentation: AROM  Complications: Retained placenta  Hospital course:  Onset of Labor With Vaginal Delivery     28 y.o. yo [id number] at [redacted]w[redacted]d was admitted in Latent Labor on 03/15/2019. Patient had an uncomplicated labor course as follows:  Membrane Rupture Time/Date: 5:33 AM ,03/15/2019   Intrapartum Procedures: Episiotomy: None [1]                                         Lacerations:  2nd degree [3]  Patient had a delivery of a Viable infant. 03/15/2019  Information for the patient's newborn:  Sarayah, Corio [[id number]]  Delivery Method: Vaginal, Spontaneous(Filed from Delivery Summary)    Pateint had an uncomplicated postpartum course.  She is ambulating, tolerating a regular diet, passing flatus, and urinating well. Patient is discharged home in stable condition on 03/16/19.   Magnesium Sulfate recieved: No BMZ received: No  Physical exam  Vitals:   03/15/19 1700 03/15/19 2122 03/16/19 0100 03/16/19 0502  BP: 97/68 117/85 98/76 99/70   Pulse: 100 (!) 108 100 91  Resp: 16 16 16 16   Temp:  98.7 F (37.1 C) 98.4 F (36.9 C) 97.8 F (36.6 C)  TempSrc:  Oral Oral Oral  SpO2: 100% 98% 98% 98%  Weight:      Height:       General: alert, cooperative and no distress Lochia: appropriate Uterine Fundus: firm Incision: N/A DVT Evaluation: No  evidence of DVT seen on physical exam. Labs: Lab Results  Component Value Date   WBC 15.5 (H) 03/15/2019   HGB 13.4 03/15/2019   HCT 41.1 03/15/2019   MCV 90.7 03/15/2019   PLT 269 03/15/2019   CMP Latest Ref Rng & Units 03/25/2015  Glucose 70 - 99 mg/dL 21(J)  BUN 6 - 23 mg/dL 10  Creatinine 1.55 - 2.08 mg/dL 0.22  Sodium 336 - 122 mEq/L 137  Potassium 3.5 - 5.3 mEq/L 4.4  Chloride 96 - 112 mEq/L 104  CO2 19 - 32 mEq/L 21  Calcium 8.4 - 10.5 mg/dL 9.6  Total Protein 6.0 - 8.3 g/dL 7.2  Total Bilirubin 0.2 - 1.2 mg/dL 0.4  Alkaline Phos 39 - 117 U/L 52  AST 0 - 37 U/L 13  ALT 0 - 35 U/L 8    Discharge instruction: per After Visit Summary and "Baby and Me Booklet".  After visit meds:  Allergies as of 03/16/2019   No Known Allergies     Medication List    TAKE these medications   ibuprofen 600 MG tablet Commonly known as:  ADVIL Take 1 tablet (600 mg total) by mouth every 6 (six) hours.   prenatal multivitamin Tabs tablet Take 1 tablet by mouth daily at 12 noon.       Diet: routine diet  Activity: Advance as tolerated. Pelvic rest for 6 weeks.   Outpatient follow up:6 weeks Follow up Appt:No future appointments. Follow up Visit:  Please schedule this patient for Postpartum visit in: 6 weeks with the following provider: [facility] For [facility]S patients schedule nurse incision check in weeks 2 weeks: no Low risk pregnancy complicated by: nothing Delivery mode:  SVD Anticipated Birth Control:  other/unsure PP Procedures needed: nothing  Schedule Integrated BH visit: no   Newborn Data: Live born female  Birth Weight: 7 lb 5 oz (3317 g) APGAR: 9, 9  Newborn Delivery   Birth date/time:  03/15/2019 13:40:00 Delivery type:  Vaginal, Spontaneous     Baby Feeding: Breast Disposition:home with mother   03/16/2019 Gwenevere AbbotNimeka Aijalon Kirtz, MD

## 2019-03-15 NOTE — H&P (Signed)
LABOR AND DELIVERY ADMISSION HISTORY AND PHYSICAL NOTE  Catalina LungerHai Modi is a 28 y.o. female G2P1001 with IUP at 6862w3d by LMP=13w US presenting for SOL.  She reports positive fetal movement. She denies leakage of fluid or vaginal bleeding.  Pren[facility] History/Complications: [facility] Pregnancy complications:  - GBS in urine  Past Medical History: Past Medical History:  Diagnosis Date  . Medical history non-contributory     Past Surgical History: Past Surgical History:  Procedure Laterality Date  . NO PAST SURGERIES    . WISDOM TOOTH EXTRACTION      Obstetrical History: OB History    Gravida  2   Para  1   Term  1   Preterm      AB      Living  1     SAB      TAB      Ectopic      Multiple  0   Live Births  1           Social History: Social History   Socioeconomic History  . Marital status: Married    Spouse name: Not on file  . Number of children: Not on file  . Years of education: Not on file  . Highest education level: Not on file  Occupational History  . Not on file  Social Needs  . Financial resource strain: Not on file  . Food insecurity:    Worry: Not on file    Inability: Not on file  . Transportation needs:    Medical: Not on file    Non-medical: Not on file  Tobacco Use  . Smoking status: Never Smoker  . Smokeless tobacco: Never Used  Substance and Sexual Activity  . Alcohol use: No    Alcohol/week: 0.0 standard drinks  . Drug use: No  . Sexual activity: Yes  Lifestyle  . Physical activity:    Days per week: Not on file    Minutes per session: Not on file  . Stress: Not on file  Relationships  . Social connections:    Talks on phone: Not on file    Gets together: Not on file    Attends religious service: Not on file    Active member of club or organization: Not on file    Attends meetings of clubs or organizations: Not on file    Relationship status: Not on file  Other Topics Concern  . Not on file  Social History  Narrative  . Not on file    Family History: History reviewed. No pertinent family history.  Allergies: No Known Allergies  Medications Prior to Admission  Medication Sig Dispense Refill Last Dose  . Prenatal Vit-Fe Fumarate-FA (PRENATAL MULTIVITAMIN) TABS tablet Take 1 tablet by mouth daily at 12 noon.   03/08/2019 at Unknown time     Review of Systems  All systems reviewed and negative except as stated in HPI  Physical Exam Blood pressure 112/76, pulse (!) 109, temperature 97.9 F (36.6 C), resp. rate 16, weight 70.3 kg, last menstrual period 06/19/2018, SpO2 99 %, unknown if currently breastfeeding. General appearance: alert, oriented, NAD Lungs: normal respiratory effort Heart: regular rate Abdomen: soft, non-tender; gravid, FH appropriate for GA Extremities: No calf swelling or tenderness Presentation: cephalic Fetal monitoring: baseline 140/mod var/+ acels/no decels Uterine activity: q3-131m Dilation: 4 Effacement (%): 70 Station: -3 Exam by:: Zenia ResidesNikki Risheq, RN [phone]  Prenatal labs: ABO, Rh:   O pos Antibody:   neg Rubella:  immune RPR:   neg HBsAg:   neg HIV:   neg GC/Chlamydia: neg GBS:   positive 3-hr GTT: 86/156/46/59 Genetic screening:  neg Anatomy US: Normal  Prenatal Transfer Tool  Maternal Diabetes: No Genetic Screening: Normal Maternal Ultrasounds/Referrals: Normal Fetal Ultrasounds or other Referrals:  None Maternal Substance Abuse:  No Significant Maternal Medications:  None Significant Maternal Lab Results: None  No results found for this or any previous visit (from the past 24 hour(s)).  Patient Active Problem List   Diagnosis Date Noted  . Active labor 11/21/2015  . Encounter for (NT) nuchal translucency scan   . [redacted] weeks gestation of pregnancy     Assessment: Trishika Janus is a 28 y.o. G2P1001 at [redacted]w[redacted]d here for SOL  #Labor: progressing well #Pain: undecided #FWB: Cat 1 #ID:  GBS positive - penicillin ordered  #MOF: breast #MOC:  undecided #Circ:  NA  Leshonda Galambos,MD 03/15/2019, 12:51 AM

## 2019-03-16 MED ORDER — IBUPROFEN 600 MG PO TABS: 600.0000 mg | ORAL_TABLET | Freq: Four times a day (QID) | ORAL | 0 refills | Status: AC

## 2019-03-16 NOTE — Progress Notes (Signed)
Post Partum Day 1 s/p NSVD and removal of retained POC Subjective: no complaints, up ad lib, voiding and tolerating PO  Objective: Blood pressure 99/70, pulse 91, temperature 97.8 F (36.6 C), temperature source Oral, resp. rate 16, height 5' (1.524 m), weight 70.3 kg, last menstrual period 06/19/2018, SpO2 98 %, unknown if currently breastfeeding.  Physical Exam:  General: alert Lochia: appropriate Uterine Fundus: firm and appropriately tender at U DVT Evaluation: No evidence of DVT seen on physical exam.  Recent Labs    03/15/19 0109  HGB 13.4  HCT 41.1    Assessment/Plan: Plan for discharge tomorrow  Continue zosyn for a total of 24 hours   LOS: 1 day   Shelley Conner 03/16/2019, 10:54 AM
